# Patient Record
Sex: Female | Born: 1935 | Race: White | Hispanic: No | Marital: Married | Smoking: Former smoker
Health system: Southern US, Community
[De-identification: ages and names within clinical notes are randomized; demographics above are authoritative.]

## PROBLEM LIST (undated history)

## (undated) DIAGNOSIS — J439 Emphysema, unspecified: Secondary | ICD-10-CM

## (undated) DIAGNOSIS — C140 Malignant neoplasm of pharynx, unspecified: Secondary | ICD-10-CM

## (undated) DIAGNOSIS — Z923 Personal history of irradiation: Secondary | ICD-10-CM

## (undated) DIAGNOSIS — J449 Chronic obstructive pulmonary disease, unspecified: Secondary | ICD-10-CM

## (undated) DIAGNOSIS — K219 Gastro-esophageal reflux disease without esophagitis: Secondary | ICD-10-CM

## (undated) DIAGNOSIS — C3490 Malignant neoplasm of unspecified part of unspecified bronchus or lung: Secondary | ICD-10-CM

## (undated) DIAGNOSIS — C801 Malignant (primary) neoplasm, unspecified: Secondary | ICD-10-CM

## (undated) DIAGNOSIS — F419 Anxiety disorder, unspecified: Secondary | ICD-10-CM

## (undated) DIAGNOSIS — Z9221 Personal history of antineoplastic chemotherapy: Secondary | ICD-10-CM

## (undated) DIAGNOSIS — Z9981 Dependence on supplemental oxygen: Secondary | ICD-10-CM

## (undated) DIAGNOSIS — I10 Essential (primary) hypertension: Secondary | ICD-10-CM

## (undated) DIAGNOSIS — G309 Alzheimer's disease, unspecified: Secondary | ICD-10-CM

## (undated) DIAGNOSIS — F0280 Dementia in other diseases classified elsewhere without behavioral disturbance: Secondary | ICD-10-CM

## (undated) HISTORY — DX: Personal history of antineoplastic chemotherapy: Z92.21

## (undated) HISTORY — DX: Alzheimer's disease, unspecified: G30.9

## (undated) HISTORY — DX: Alzheimer's disease, unspecified: F02.80

## (undated) HISTORY — PX: TONSILLECTOMY: SUR1361

## (undated) HISTORY — DX: Chronic obstructive pulmonary disease, unspecified: J44.9

## (undated) HISTORY — PX: CHOLECYSTECTOMY: SHX55

## (undated) HISTORY — DX: Malignant neoplasm of unspecified part of unspecified bronchus or lung: C34.90

## (undated) HISTORY — DX: Dependence on supplemental oxygen: Z99.81

## (undated) HISTORY — DX: Malignant neoplasm of pharynx, unspecified: C14.0

## (undated) HISTORY — DX: Emphysema, unspecified: J43.9

## (undated) HISTORY — DX: Anxiety disorder, unspecified: F41.9

## (undated) HISTORY — DX: Gastro-esophageal reflux disease without esophagitis: K21.9

## (undated) HISTORY — PX: TOTAL ABDOMINAL HYSTERECTOMY: SHX209

## (undated) HISTORY — DX: Personal history of irradiation: Z92.3

---

## 2001-07-05 ENCOUNTER — Encounter: Payer: Self-pay | Admitting: Neurology

## 2001-07-05 ENCOUNTER — Encounter: Admission: RE | Admit: 2001-07-05 | Discharge: 2001-07-05 | Payer: Self-pay | Admitting: Neurology

## 2002-10-20 ENCOUNTER — Ambulatory Visit (HOSPITAL_COMMUNITY): Admission: RE | Admit: 2002-10-20 | Discharge: 2002-10-20 | Payer: Self-pay | Admitting: Cardiology

## 2006-07-14 ENCOUNTER — Ambulatory Visit: Payer: Self-pay | Admitting: Orthopedic Surgery

## 2006-11-16 ENCOUNTER — Ambulatory Visit: Payer: Self-pay | Admitting: Cardiology

## 2006-11-16 ENCOUNTER — Ambulatory Visit: Payer: Self-pay | Admitting: Pulmonary Disease

## 2006-11-16 ENCOUNTER — Ambulatory Visit: Payer: Self-pay | Admitting: Thoracic Surgery

## 2006-11-16 ENCOUNTER — Encounter: Payer: Self-pay | Admitting: Critical Care Medicine

## 2006-11-16 ENCOUNTER — Inpatient Hospital Stay (HOSPITAL_COMMUNITY): Admission: EM | Admit: 2006-11-16 | Discharge: 2006-12-08 | Payer: Self-pay | Admitting: Critical Care Medicine

## 2006-11-21 ENCOUNTER — Encounter: Payer: Self-pay | Admitting: Critical Care Medicine

## 2006-11-24 ENCOUNTER — Ambulatory Visit: Payer: Self-pay | Admitting: Internal Medicine

## 2006-11-25 ENCOUNTER — Encounter: Payer: Self-pay | Admitting: Critical Care Medicine

## 2006-12-03 ENCOUNTER — Encounter: Payer: Self-pay | Admitting: Thoracic Surgery

## 2006-12-06 ENCOUNTER — Encounter: Payer: Self-pay | Admitting: Thoracic Surgery

## 2006-12-14 ENCOUNTER — Ambulatory Visit: Admission: RE | Admit: 2006-12-14 | Discharge: 2007-01-03 | Payer: Self-pay | Admitting: Radiation Oncology

## 2006-12-17 ENCOUNTER — Ambulatory Visit (HOSPITAL_COMMUNITY): Admission: RE | Admit: 2006-12-17 | Discharge: 2006-12-17 | Payer: Self-pay | Admitting: Thoracic Surgery

## 2006-12-20 ENCOUNTER — Ambulatory Visit: Payer: Self-pay | Admitting: Thoracic Surgery

## 2006-12-21 ENCOUNTER — Encounter: Admission: RE | Admit: 2006-12-21 | Discharge: 2006-12-21 | Payer: Self-pay | Admitting: Thoracic Surgery

## 2006-12-21 ENCOUNTER — Ambulatory Visit: Payer: Self-pay | Admitting: Thoracic Surgery

## 2006-12-27 ENCOUNTER — Ambulatory Visit: Payer: Self-pay | Admitting: Pulmonary Disease

## 2007-01-07 ENCOUNTER — Ambulatory Visit: Payer: Self-pay | Admitting: Pulmonary Disease

## 2007-01-07 DIAGNOSIS — I472 Ventricular tachycardia: Secondary | ICD-10-CM

## 2007-01-07 DIAGNOSIS — I10 Essential (primary) hypertension: Secondary | ICD-10-CM | POA: Insufficient documentation

## 2007-01-07 DIAGNOSIS — J9600 Acute respiratory failure, unspecified whether with hypoxia or hypercapnia: Secondary | ICD-10-CM

## 2007-01-07 DIAGNOSIS — R042 Hemoptysis: Secondary | ICD-10-CM | POA: Insufficient documentation

## 2007-01-07 DIAGNOSIS — C3490 Malignant neoplasm of unspecified part of unspecified bronchus or lung: Secondary | ICD-10-CM | POA: Insufficient documentation

## 2007-01-07 DIAGNOSIS — J4489 Other specified chronic obstructive pulmonary disease: Secondary | ICD-10-CM | POA: Insufficient documentation

## 2007-01-07 DIAGNOSIS — J449 Chronic obstructive pulmonary disease, unspecified: Secondary | ICD-10-CM

## 2007-01-07 DIAGNOSIS — R69 Illness, unspecified: Secondary | ICD-10-CM

## 2007-01-07 DIAGNOSIS — F17200 Nicotine dependence, unspecified, uncomplicated: Secondary | ICD-10-CM

## 2007-02-11 ENCOUNTER — Ambulatory Visit: Admission: RE | Admit: 2007-02-11 | Discharge: 2007-03-31 | Payer: Self-pay | Admitting: Radiation Oncology

## 2007-05-10 ENCOUNTER — Ambulatory Visit (HOSPITAL_COMMUNITY): Admission: RE | Admit: 2007-05-10 | Discharge: 2007-05-10 | Payer: Self-pay | Admitting: Internal Medicine

## 2007-07-06 ENCOUNTER — Ambulatory Visit: Payer: Self-pay | Admitting: Thoracic Surgery

## 2007-07-15 ENCOUNTER — Encounter: Payer: Self-pay | Admitting: Thoracic Surgery

## 2007-07-15 ENCOUNTER — Ambulatory Visit (HOSPITAL_COMMUNITY): Admission: RE | Admit: 2007-07-15 | Discharge: 2007-07-15 | Payer: Self-pay | Admitting: Thoracic Surgery

## 2007-07-19 ENCOUNTER — Ambulatory Visit: Payer: Self-pay | Admitting: Thoracic Surgery

## 2007-09-28 ENCOUNTER — Ambulatory Visit: Payer: Self-pay | Admitting: Thoracic Surgery

## 2007-12-07 ENCOUNTER — Ambulatory Visit: Payer: Self-pay | Admitting: Thoracic Surgery

## 2007-12-07 ENCOUNTER — Encounter: Admission: RE | Admit: 2007-12-07 | Discharge: 2007-12-07 | Payer: Self-pay | Admitting: Thoracic Surgery

## 2008-04-03 ENCOUNTER — Ambulatory Visit (HOSPITAL_COMMUNITY): Admission: RE | Admit: 2008-04-03 | Discharge: 2008-04-03 | Payer: Self-pay | Admitting: Thoracic Surgery

## 2008-04-03 ENCOUNTER — Ambulatory Visit: Payer: Self-pay | Admitting: Thoracic Surgery

## 2008-04-06 ENCOUNTER — Encounter: Payer: Self-pay | Admitting: Thoracic Surgery

## 2008-04-06 ENCOUNTER — Ambulatory Visit (HOSPITAL_COMMUNITY): Admission: RE | Admit: 2008-04-06 | Discharge: 2008-04-06 | Payer: Self-pay | Admitting: Thoracic Surgery

## 2008-04-06 ENCOUNTER — Ambulatory Visit: Payer: Self-pay | Admitting: Thoracic Surgery

## 2008-04-10 ENCOUNTER — Ambulatory Visit: Payer: Self-pay | Admitting: Thoracic Surgery

## 2009-07-24 ENCOUNTER — Ambulatory Visit: Payer: Self-pay | Admitting: Thoracic Surgery

## 2009-07-30 ENCOUNTER — Ambulatory Visit: Payer: Self-pay | Admitting: Thoracic Surgery

## 2009-07-30 ENCOUNTER — Ambulatory Visit (HOSPITAL_COMMUNITY): Admission: RE | Admit: 2009-07-30 | Discharge: 2009-07-30 | Payer: Self-pay | Admitting: Thoracic Surgery

## 2009-08-07 ENCOUNTER — Ambulatory Visit: Payer: Self-pay | Admitting: Thoracic Surgery

## 2010-01-20 ENCOUNTER — Ambulatory Visit (HOSPITAL_COMMUNITY)
Admission: RE | Admit: 2010-01-20 | Discharge: 2010-01-20 | Payer: Self-pay | Source: Home / Self Care | Attending: Internal Medicine | Admitting: Internal Medicine

## 2010-01-21 ENCOUNTER — Ambulatory Visit
Admission: RE | Admit: 2010-01-21 | Discharge: 2010-01-21 | Payer: Self-pay | Source: Home / Self Care | Attending: Internal Medicine | Admitting: Internal Medicine

## 2010-01-22 LAB — GLUCOSE, CAPILLARY: Glucose-Capillary: 105 mg/dL — ABNORMAL HIGH (ref 70–99)

## 2010-02-07 NOTE — Consult Note (Addendum)
[name], Heather Rosales                   ACCOUNT NO.:  [id number]  MEDICAL RECORD NO.:  [id number]           PATIENT TYPE:  LOCATION:                                 FACILITY:  PHYSICIAN:  Lionel December, M.D.    DATE OF BIRTH:  [birth year]  DATE OF CONSULTATION:  01/21/2010 DATE OF DISCHARGE:                                CONSULTATION   REASON FOR CONSULT:  GERD.  HISTORY OF PRESENT ILLNESS:  Ms. Heather Rosales is a 77 year old female referred to our office by Dr. Derald Macleod for GERD.  She does have a history of lung cancer and COPD.  Ms. Pittsley states she is having acid reflux every night.  This has been occurring approximately 2 years since her lung cancer was diagnosed.  She is presently taking omeprazole which is not completely controlling her symptoms.  Her appetite is okay.  She states she just does not feel hungry.  She has had weight loss of approximately 10 pounds since 2008 after being diagnosed with lung cancer.  She states her acid reflux rarely occurs during the day.  She does have a cough.  Her cough is not new.  She did receive radiation and chemo in 2009.  On January 10 of this year, she underwent a CT of the chest and abdomen with contrast media which revealed no findings or malignancy in the upper abdomen.  Mild fatty infiltration of the liver.  She is allergic to PENICILLIN.  Home medications include: 1. Omeprazole 40 mg 2 a day. 2. Lotrel 5/40 one a day. 3. Spiriva 18 mics 1 puff daily. 4. Atenolol 50 mg 2 a day. 5. FiberChoice one daily. 6. Os-Cal 500 one a day. 7. MVI one a day. 8. Diovan/hydrochlorothiazide 160/125 mg one a day. 9. Hydrochlorpher for cough as needed.  SURGERIES:  She has had a cholecystectomy in the past and repair of a deviated septum.  Previous medical history includes lung cancer, throat cancer and she has a radiation and chemo.  Her last colonoscopy was about a year ago by Dr. Cleotis Nipper which was normal.  FAMILY HISTORY:  Her mother is  deceased of unknown cause.  Her father is deceased from lung cancer.  She has 2 sisters that are deceased, one from a lymph node cancer and one from questionable some type of cancer. She did not know.  She had one brother alive with a history of lung cancer.  She is married.  She is retired.  She quit smoking in 2008 after smoking one pack a day for 50 years or greater.  She does not drink or do drugs.  She has 4 children in good health.  One daughter, however, did have breast cancer in 2006 and is in remission.  OBJECTIVE:  VITAL SIGNS:  Her weight is 140.5, her height is 5 feet 2 inches, her temp is 99, blood pressure is 140/52, pulse is 62. MOUTH:  She has natural teeth.  Her oral mucosa is moist. NECK:  There are no lesions.  Her thyroid is normal.  There is no cervical lymphadenopathy. EYES:  Her conjunctivae are pink.  Her sclerae are anicteric. HEART:  Regular rate and rhythm. ABDOMEN:  Soft.  Bowel sounds are positive.  No masses.  ASSESSMENT:  Ms. Heather Rosales is a 77 year old female with a history of gastroesophageal reflux disease which usually occurs at night.  Peptic ulcer disease or complicated GERD needs to be ruled out. Marland Kitchen  RECOMMENDATIONS:  We will schedule an EGD with Dr. Karilyn Cota.    ______________________________ Dorene Ar, NP   ______________________________ Lionel December, M.D.    TS/MEDQ  D:  01/21/2010  T:  01/22/2010  Job:  [id number]  cc:   Normand Sloop, MD Fax: [phone]  Electronically Signed by Dorene Ar PA on 02/25/2010 12:08:37 PM Electronically Signed by Lionel December M.D. on 03/22/2010 02:19:43 PM

## 2010-02-26 ENCOUNTER — Other Ambulatory Visit (INDEPENDENT_AMBULATORY_CARE_PROVIDER_SITE_OTHER): Payer: Self-pay | Admitting: Internal Medicine

## 2010-02-26 ENCOUNTER — Encounter (HOSPITAL_BASED_OUTPATIENT_CLINIC_OR_DEPARTMENT_OTHER): Payer: Medicare Other | Admitting: Internal Medicine

## 2010-02-26 ENCOUNTER — Ambulatory Visit (HOSPITAL_COMMUNITY)
Admission: RE | Admit: 2010-02-26 | Discharge: 2010-02-26 | Disposition: A | Discharge status: Home / Self Care | Payer: Medicare Other | Source: Ambulatory Visit | Attending: Internal Medicine | Admitting: Internal Medicine

## 2010-02-26 DIAGNOSIS — J449 Chronic obstructive pulmonary disease, unspecified: Secondary | ICD-10-CM | POA: Insufficient documentation

## 2010-02-26 DIAGNOSIS — Z85118 Personal history of other malignant neoplasm of bronchus and lung: Secondary | ICD-10-CM

## 2010-02-26 DIAGNOSIS — K219 Gastro-esophageal reflux disease without esophagitis: Secondary | ICD-10-CM

## 2010-02-26 DIAGNOSIS — Q2733 Arteriovenous malformation of digestive system vessel: Secondary | ICD-10-CM | POA: Insufficient documentation

## 2010-02-26 DIAGNOSIS — J4489 Other specified chronic obstructive pulmonary disease: Secondary | ICD-10-CM | POA: Insufficient documentation

## 2010-02-26 DIAGNOSIS — I10 Essential (primary) hypertension: Secondary | ICD-10-CM | POA: Insufficient documentation

## 2010-03-03 ENCOUNTER — Encounter (HOSPITAL_COMMUNITY)
Admit: 2010-03-03 | Discharge: 2010-03-03 | Disposition: A | Discharge status: Home / Self Care | Payer: Medicare Other | Source: Ambulatory Visit | Attending: Internal Medicine | Admitting: Internal Medicine

## 2010-03-03 ENCOUNTER — Encounter (HOSPITAL_COMMUNITY): Payer: Self-pay

## 2010-03-03 DIAGNOSIS — K219 Gastro-esophageal reflux disease without esophagitis: Secondary | ICD-10-CM | POA: Insufficient documentation

## 2010-03-03 HISTORY — DX: Essential (primary) hypertension: I10

## 2010-03-03 HISTORY — DX: Malignant (primary) neoplasm, unspecified: C80.1

## 2010-03-03 MED ORDER — TECHNETIUM TC 99M SULFUR COLLOID
2.0000 | Freq: Once | INTRAVENOUS | Status: AC | PRN
  Administered 2010-03-03: 1.95 via INTRAVENOUS

## 2010-03-22 LAB — COMPREHENSIVE METABOLIC PANEL
ALT: 13 U/L (ref 0–35)
AST: 14 U/L (ref 0–37)
Calcium: 9.2 mg/dL (ref 8.4–10.5)
GFR calc Af Amer: >60 mL/min (ref >60)
Sodium: 136 mEq/L (ref 135–145)
Total Protein: 6.1 g/dL (ref 6.0–8.3)

## 2010-03-22 LAB — CBC
MCHC: 32.2 g/dL (ref 30.0–36.0)
Platelets: 228 10*3/uL (ref 150–400)
RDW: 18.8 % — ABNORMAL HIGH (ref 11.5–15.5)

## 2010-03-22 LAB — PROTIME-INR: Prothrombin Time: 12.8 seconds (ref 11.6–15.2)

## 2010-03-22 LAB — SURGICAL PCR SCREEN
MRSA, PCR: NEGATIVE
Staphylococcus aureus: NEGATIVE

## 2010-03-22 NOTE — Op Note (Signed)
Heather Rosales, Heather Rosales                   ACCOUNT NO.:  [id number]  MEDICAL RECORD NO.:  [id number]           PATIENT TYPE:  O  LOCATION:  DAYP                          FACILITY:  [facility]  PHYSICIAN:  Lionel December, M.D.    DATE OF BIRTH:  05-16-1930  DATE OF PROCEDURE:  02/26/2010 DATE OF DISCHARGE:                              OPERATIVE REPORT   PROCEDURE:  Esophagogastroduodenoscopy.  INDICATIONS:  Heather Rosales is 77 year old Caucasian female with a chronic GERD and currently on double dose PPI who is still having problems.  She is mainly having throat symptoms.  She wakes up middle of night with liquid in her throat.  She states it is very foul smelling.  She denies postnasal discharge.  She also has cough.  She has history of stage III non-small cell lung carcinoma which was diagnosed late 2008, and she received chemoradiation in 2009 and she has remained in remission.  Last year, she underwent multiple studies and no recurrence noted.  This year, she also had abdominopelvic CT, chest CT, and a PET scan and no evidence of recurrent disease.  She is undergoing diagnostic EGD to determine whether or not her throat symptoms are due to GERD or not. Procedures were reviewed with the patient.  Informed consent was obtained.  MEDS FOR CONSCIOUS SEDATION:  Cetacaine spray for oropharyngeal topical anesthesia, Demerol 50 mg IV, Versed 4 mg IV.  FINDINGS:  Procedure performed in endoscopy suite.  The patient's vital signs and O2 sat were monitored during the procedure and remained stable.  The patient was placed in left lateral recumbent position and Pentax videoscope was passed through oropharynx without any difficulty into esophagus.  Oropharyngeal mucosa is somewhat dry, otherwise unremarkable.  Laryngeal inlet was also unremarkable.  Scope was easily passed into esophagus.  Esophagus.  In the proximal esophagus between 22 and 24 cm, the incisors was noncritical ring with pallor or scar to the  mucosa and there was focal area with very slight or faint mucosal irregularity.  This area was brushed for cytology on the way out, but I did not see any obvious mass or ulcer.  Rest of the esophageal mucosa was normal.  GE junction was located at 38 cm from the incisors and was unremarkable.  Stomach.  It was empty and distended very well by insufflation.  Folds of proximal stomach are normal.  Examination of mucosa at body, antrum, pyloric channel, as well as angularis, fundus, and cardia was normal.  Duodenum.  Bulbar mucosa was normal.  Scope was passed in second part of the duodenum.  There was single small AV malformation which was not bleeding and left alone.  Endoscope was withdrawn.  The patient tolerated the procedure well.  FINAL DIAGNOSES: 1. Focal scarring with mucosal irregularity at proximal esophagus,     felt to be radiation-induced injury, but no evidence of ulcer or     mass or stricture. 2. No evidence of erosive esophagitis or Barrett's esophagus. 3. No evidence of pyloric stenosis or peptic ulcer disease. 4. Given endoscopic findings, I am not convinced that her throat  symptoms are primarily due to gastroesophageal reflux disease.  However, I would like to make sure, she does not have gastroparesis.  RECOMMENDATIONS: 1. She will continue antireflux measures and omeprazole as before. 2. We will schedule for solid phase gastric emptying study. 3. I will be contacting patient with results of breast cytology from     esophagus. 4. If her gastric emptying study is normal, I would ask Dr. Sherril Croon to     consider either sinus CT or another course of antibiotics, etc.     Lionel December, M.D.     NR/MEDQ  D:  02/26/2010  T:  02/26/2010  Job:  [id number]  cc:   Dr. Derald Macleod  Dr. Ovidio Kin, M.D. [address] [place], [place] [address]  Electronically Signed by Lionel December M.D. on 03/22/2010 02:20:03 PM

## 2010-04-16 LAB — COMPREHENSIVE METABOLIC PANEL
ALT: 12 U/L (ref 0–35)
AST: 13 U/L (ref 0–37)
Albumin: 3.6 g/dL (ref 3.5–5.2)
Alkaline Phosphatase: 75 U/L (ref 39–117)
Glucose, Bld: 103 mg/dL — ABNORMAL HIGH (ref 70–99)
Potassium: 4.2 mEq/L (ref 3.5–5.1)
Sodium: 140 mEq/L (ref 135–145)
Total Protein: 5.9 g/dL — ABNORMAL LOW (ref 6.0–8.3)

## 2010-04-16 LAB — CBC
Hemoglobin: 11.7 g/dL — ABNORMAL LOW (ref 12.0–15.0)
RDW: 16.7 % — ABNORMAL HIGH (ref 11.5–15.5)

## 2010-04-16 LAB — CULTURE, RESPIRATORY W GRAM STAIN

## 2010-04-16 LAB — PROTIME-INR: Prothrombin Time: 13 seconds (ref 11.6–15.2)

## 2010-04-16 LAB — AFB CULTURE WITH SMEAR (NOT AT ARMC)

## 2010-05-20 NOTE — Letter (Signed)
July 24, 2009   Normand Sloop, MD  [address]  [place], [place]  [address]   Re:  Heather Rosales, Heather Rosales             DOB:  Jan 25, 1930   Dear Jeb Levering,   I saw the patient in the office today and I scheduled her Port-A-Cath  removal for July 26.  Her blood pressure was 133/69, pulse is 70,  respirations 18, sats were 92%.  She is doing remarkably well from my  standpoint, and I will be happy to see her again if she has any future  problems.    Sincerely,   Ines Bloomer, M.D.  Electronically Signed   DPB/MEDQ  D:  07/24/2009  T:  07/25/2009  Job:  [id number]

## 2010-05-20 NOTE — Op Note (Signed)
[name], Heather Rosales                   ACCOUNT NO.:  [id number]   MEDICAL RECORD NO.:  [id number]          PATIENT TYPE:  AMB   LOCATION:  SDS                          FACILITY:  [facility]   PHYSICIAN:  Ines Bloomer, M.D. DATE OF BIRTH:  [birth year]   DATE OF PROCEDURE:  07/15/2007  DATE OF DISCHARGE:                               OPERATIVE REPORT   PREOPERATIVE DIAGNOSIS:  Stage IIIA nonsmall cell lung cancer, status  post radiation and chemotherapy.   POSTOPERATIVE DIAGNOSIS:  Stage IIIA nonsmall cell lung cancer, status  post radiation and chemotherapy.   OPERATION PERFORMED:  Video bronchoscopy with EBUS.   ANESTHESIA:  General anesthesia.   PROCEDURE:  After adequate general anesthesia, the video bronchoscope  was passed through the endotracheal tube.  The carina was in the  midline.  The left upper lobe and left lower orifices were normal.  The  right main stem, right upper lobe, and right lower lobe orifices were  normal.  The video bronchoscope was removed, EBUS bronchoscope was  inserted, and exploration was carried out with the EBUS bronchoscope.  We identified a 4R node that had been previously positive on the PET  scan prior to the treatment, and it was now negative.   Using an endobronchial ultrasound for guidance, we did a Wang needle of  this with 3 passes aspirating the node.  The EBUS bronchoscope was then  removed.  The patient tolerated the procedure well, and was returned to  the recovery room in stable condition.      Ines Bloomer, M.D.  Electronically Signed     DPB/MEDQ  D:  07/15/2007  T:  07/16/2007  Job:  [id number]   cc:   Lebron Conners. Darovsky, M.D.

## 2010-05-20 NOTE — Letter (Signed)
April 03, 2008   Boris M. Darovsky, MD  [address]  [place], [place] [address]   Re:  Heather Rosales, Heather Rosales             DOB:  1930-03-24   Dear Dr. Ubaldo Glassing:   I saw the patient back today.  She had a mild episode of hemoptysis.  She has a PET scan done today that showed no evidence of any recurrence.  There is a small new 8-mm nodule in the right lower lobe, which we will  need to follow, which showed no uptake.  Because of her hemoptysis, I  think she needs to have a repeat bronchoscopy, and we have scheduled it  for this Friday, April 07, 2008, at [facility].  I will let she know  our findings.   Sincerely,   Ines Bloomer, M.D.  Electronically Signed   DPB/MEDQ  D:  04/03/2008  T:  04/04/2008  Job:  [id number]

## 2010-05-20 NOTE — Letter (Signed)
August 07, 2009   Boris Judie Petit. Darovsky, MD  [address] [place], [place] [address]   Re:  Heather Rosales, Heather Rosales             DOB:  1930-11-14   Dear Dr. Ubaldo Glassing:   The patient returned today after removal of Port-A-Cath.  Incision is  well healed.  Her blood pressure is 148/80, pulse 64, respirations 18,  sats 92%.  She is complaining of acid reflux for which she is on  omeprazole for this, but she says it has been there quite a bit, so I  suggested that she check with Dr. Sherril Croon and possibly refer to Dr. Karilyn Cota  for possible workup.  I will be happy to see her again if she has any  future problems.   Ines Bloomer, M.D.  Electronically Signed   DPB/MEDQ  D:  08/07/2009  T:  08/08/2009  Job:  [id number]   cc:   Doreen Beam, MD

## 2010-05-20 NOTE — Op Note (Signed)
NAMEGABRIELL, Heather Rosales                   ACCOUNT NO.:  [id number]   MEDICAL RECORD NO.:  [id number]          PATIENT TYPE:  AMB   LOCATION:  SDS                          FACILITY:  [facility]   PHYSICIAN:  Ines Bloomer, M.D. DATE OF BIRTH:  10-07-1930   DATE OF PROCEDURE:  DATE OF DISCHARGE:  12/17/2006                               OPERATIVE REPORT   PREOPERATIVE DIAGNOSIS:  Stage IIIA non-small cell lung cancer.   POSTOPERATIVE DIAGNOSIS:  Stage IIIA non-small cell lung cancer.   OPERATION PERFORMED:  Insertion of right IJ Port-A-Cath.   SURGEON:  Dr. Patricia Nettle. Burney.   ANESTHESIA:  1% Xylocaine and IV sedation.   After prep and drape in the anterior chest and both necks, an attempt  was made to do a left IJ Port-A-Cath and the guidewire would not thread.  It kept going up the right neck into the right IJ. Despite several  wires, I could not get it to turn down into the SVC. So we abandoned  that and infiltrated 1% Xylocaine at the right IJ and made  a right IJ  puncture and threaded the guidewire under fluoro to the right atrium. A  stab wound was made around the guidewire, another area was infiltrated  with 1% Xylocaine. On the anterior chest wall a  transverse incision was  made and a pocket was dissected out for the [employer].  It was inserted in the pocket and sutured in place with 3-0 silk through  the suture port and then the tubing was tunneled to the stab wound  around the guidewire.  The tubing was measured appropriately to be in  the right atrium and cut and then over the guidewire was passed a  dilator with a peel-away sheath and through the peel-away sheath the  tubing was passed, the peel-away sheath was removed and confirmed to be  in the right atrial SVC junction by fluoro. It flushed easy and injected  easily. The wound was closed with 3-0 Vicryl in the subcutaneous tissue  and Dermabond for the skin. The patient was returned to the recovery  room in  stable condition.      Ines Bloomer, M.D.  Electronically Signed     DPB/MEDQ  D:  12/17/2006  T:  12/18/2006  Job:  [id number]

## 2010-05-20 NOTE — Discharge Summary (Signed)
[name], Rosales                   ACCOUNT NO.:  [id number]   MEDICAL RECORD NO.:  [id number]          PATIENT TYPE:  INP   LOCATION:  2013                         FACILITY:  [facility]   PHYSICIAN:  Oretha Milch, MD      DATE OF BIRTH:  05-Dec-1930   DATE OF ADMISSION:  11/16/2006  DATE OF DISCHARGE:  12/08/2006                               DISCHARGE SUMMARY   DISCHARGE DIAGNOSES:  1. Vent-dependent respiratory failure.  2. Gastrointestinal obstruction and ileus.  3. Hyperglycemia.  4. Bilateral pneumothoraces, pneumomediastinum & pneumoperitoneum from      difficult airway intubation & trauma.  5. 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 Rt adrenal involvement   HISTORY OF PRESENT ILLNESS:  Heather Rosales is a 77 year old white female  with known chronic obstructive pulmonary disease and continued tobacco  abuse and hypertension, who was transferred to [facility] via 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  She required  intubation and mechanical ventilatory support with arterial blood gases  demonstrating a pH of 7.28, pCO2 of 67, pO2 of 306.  She was noted to  have a run of ventricular tachycardia and required interventions.  Prior  to being admitted, the patient was noted to be fine.   LINES/TUBES:  1. She had a left peripheral IV placed x2.  2. She had an endotracheal tube placed on November 15, 2006, with      removal on November 25, 2006.  3. The NG tube on November 15, 2006, was subsequently removed.  4. Left radial arterial line from November 16, 2006, #21.  5. Left IJ central venous catheter which has been removed.  6. A right chest tube was placed on November 17, 2006, and removed on      November 25, 2006.  7. Left chest tube placed on November 17, 2006, and removed on      November 28, 2006.   LABORATORY DATA:   Blood cultures x2 on November 16, 2006, with no  growth.  On November 16, 2006, urine was no growth.  Sputum showed no  organism on November 16, 2006.  On November 21, 2006, pleural fluid  showed no growth.   A 2-D echocardiogram demonstrated an ejection fraction of 70%.   Hemoglobin 10.3, hematocrit 30.8, WBC 9.7, platelets 254.  Sodium 138,  potassium 4.2, chloride 100, CO2 of 31, BUN 8, creatinine 0.52, glucose  126, calcium 9.1.   RADIOGRAPHIC DATA:  PET scan on December 06, 2006, demonstrates uptake  with right paratracheal pre-carinal enlarged lymph nodes measuring 3.1  cm x 3.4 cm and maximally 5.1 cm.  Right-sided pleural and intercostal  activity corresponds to mild pleural thickening.  Mild activity in the  lateral aspect of the right lower lobe.  Air space disease, likely due  to atelectasis.  Right paratracheal lymph node demonstrates moderate  hypermetabolism.  Other paratracheal and recessed nodes demonstrate mild  equivocal hypermetabolism.  No primary  bronchogenic carcinoma is  identified.  Cannot exclude subtle early right adrenal metastases.  Attention on followup.   A chest x-ray shows bilateral pleural effusion, left greater than right,  bi-basilar atelectasis and new pneumothorax.   A CT of the neck revealed a subtle area of mucosal thickening in the  right posterior lateral aspect of the proximal sub-glottic trachea, in  the same region as the mucosal abnormalities seen at the time of  endoscopy.   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  Number 4 shows metastatic non-small-cell  carcinoma, features favor squamous cell carcinoma.  Present section  demonstrates non-small-cell carcinoma.   HOSPITAL COURSE:  #1 - VENT-DEPENDENT RESPIRATORY FAILURE:  The patient  was transferred to [facility] from [facility] in acute respiratory distress, requiring mechanical  ventilatory  support.  Of note, she has continued tobacco abuse, despite being on  home oxygen at night.  This may have contributed to it.  By the time she  arrived at [facility], she was found to have bilateral  pneumothoraces.  This was speculated from a difficult airway, as noted  prior to her transfer to [facility].  She subsequently  required bilateral chest tubes, right and left, with subsequent removal  after resolution of bilateral pneumothoraces.  She was successfully  weaned from mechanical ventilatory support.  Subsequently she went for a  mediastinoscopy on December 03, 2006, per Dr. Ines Bloomer which  demonstrated the findings as noted in the pathology of squamous cell  carcinoma, subglottic mass, with pre-carinal and paratracheal findings.  She has been arranged to follow up with Dr. Lynett Fish in [place],  [place] for radiation and chemotherapy if they deem necessary.  She will also follow up with Dr. Edwyna Shell at a later time.  She will also  follow up with Dr. Comer Locket. Alva of the pulmonary division  at a later  time.  She has been instructed to continue her relationship with Dr.  Orson Aloe in [place], [place].   #2 - CHRONIC OBSTRUCTIVE PULMONARY DISEASE:  She has proven to be O2-  dependent.  She was ambulating in the hall and found to desaturate to  84%.  Therefore she will be on home oxygen 24/7.  She has been  instructed not to smoke, although she continues to down-play the  importance of her smoking as a cause of her shortness of breath.   #3 - ILEUS:  This has resolved.   #4 - HYPERGLYCEMIA:  This is under control.   #5 - HYPERTENSION:  She will be placed back on her home medications.   DISCHARGE MEDICATIONS:  1. Norvasc 10 mg daily.  2. Spiriva 18 mcg daily.  3. Atenolol 50 mg twice daily.  4. Albuterol 2.5 mg hand held nebulizer four times daily and an extra      two times if needed.  5. Home O2, 24  hours a day at 2 liters.   FOLLOWUP:  1. With Dr. Vassie Loll.  2. With Dr. Cleone Slim.  3. With Dr. Edwyna Shell.   DIET:  Is a heart-healthy diet.   SPECIAL INSTRUCTIONS:  She is not to smoke.   DISPOSITION/CONDITION ON DISCHARGE:  Her vent-dependent respiratory  failure has resolved.  Her bilateral pneumothoraces resolved.  There is  a sub-glottic mass that has been evaluated with a mediastinoscopy and  found to be squamous cell carcinoma, with suspected metastases,  which  will be further evaluated in the future per oncology.  The chronic  obstructive pulmonary disease is now 24/7, O2-dependent.  She is being  discharged in an improved condition.      Devra Dopp, MSN, ACNP      Oretha Milch, MD  Electronically Signed    SM/MEDQ  D:  12/08/2006  T:  12/08/2006  Job:  [id number]   cc:   Ines Bloomer, M.D.  Lynett Fish, M.D.

## 2010-05-20 NOTE — Op Note (Signed)
NAMEQUANTASIA, Heather Rosales                   ACCOUNT NO.:  [id number]   MEDICAL RECORD NO.:  [id number]          PATIENT TYPE:  INP   LOCATION:  2013                         FACILITY:  [facility]   PHYSICIAN:  Ines Bloomer, M.D. DATE OF BIRTH:  01-03-1931   DATE OF PROCEDURE:  DATE OF DISCHARGE:                               OPERATIVE REPORT   PREOPERATIVE DIAGNOSIS:  Mediastinal adenopathy.   POSTOPERATIVE DIAGNOSIS:  Mediastinal adenopathy.  Non-small cell lung  cancer., possible subglottic mass.   SURGEON:  Dr. Patricia Nettle. Burney.   ANESTHESIA:  General anesthesia.   OPERATION PERFORMED:  Video bronchoscopy, mediastinoscopy.   PROCEDURE IN DETAIL:  After percutaneous insertion of monitoring lines,  the patient underwent general anesthesia was prepped and draped in the  usual sterile manner.  The video bronchoscope was passed through the  endotracheal tube.  The carina was at the midline.  The left mainstem,  left upper lobe and left lower lobe orifices were normal.  In the right  mainstem there was a small 8-10 mm nodule that was free floating in the  right mainstem bronchus. This was grasped and removed and sent for  pathologic examination.  They had difficulty in intubation and it was  thought that this may have been dislodged with intubation.  The right  middle lobe and right lower lobe orifices were normal.  The right upper  lobe orifices were normal.  We then pulled the endotracheal tube back to  the subglottic level and there were areas of marked irregularity in that  area and we did biopsies of the area.  This could have been where she  was torn on a difficult intubation or this could have been a subglottic  tumor.  The video bronchoscope was removed.  We then prepped and draped  in the usual sterile manner.  A transverse incision was made above the  sternal notch and carried down with electrocautery to the pretracheal  fascia.  We then biopsied 4R and 2R nodes.  The second biopsy was  very  difficult to biopsy because it was so scarred in, but frozen section  revealed non-small cell lung cancer. More  of the node was sent for  permanent section.  The video bronchoscope was removed. The anterior  neck was closed with interrupted 2-0 Vicryl and then running 3-0 Vicryl  and Dermabond for the skin.  The patient was returned to the recovery  room in stable condition.      Ines Bloomer, M.D.  Electronically Signed     DPB/MEDQ  D:  12/03/2006  T:  12/03/2006  Job:  [id number]   cc:   Ines Bloomer, M.D.

## 2010-05-20 NOTE — Letter (Signed)
July 06, 2007   Boris M. Darovsky, M.D.  [address] [place], [place] [address]   Re:  ILISHA, BLUST             DOB:  [birth year]   Dear Dr. Ubaldo Glassing;  I saw this patient back today.  She has completed her radiation and her  chemotherapy.  She has multiple complaints mainly hoarseness, excessive  sputum, and coughing.  Her PET scan really looks good with minimal  uptake in the mediastinum, where she received her radiation.  Her  incisions are well-healed.  Port-A-Cath is healing well.  Because of her  hoarseness and cough, I think it might be worth repeating her  bronchoscopy.  Her blood pressure is 142/70, pulse 80, respirations 22,  and sats were 90%.   I am planning to do repeat bronchoscopy on the 9th at [facility]   Ines Bloomer, M.D.  Electronically Signed   DPB/MEDQ  D:  07/06/2007  T:  07/06/2007  Job:  [id number]

## 2010-05-20 NOTE — Consult Note (Signed)
Heather Rosales, Heather Rosales                   ACCOUNT NO.:  [id number]   MEDICAL RECORD NO.:  [id number]          PATIENT TYPE:  INP   LOCATION:  2116                         FACILITY:  [facility]   PHYSICIAN:  Ines Bloomer, M.D. DATE OF BIRTH:  Jul 15, 1930   DATE OF CONSULTATION:  DATE OF DISCHARGE:                                 CONSULTATION   CHIEF COMPLAINT:  Mediastinal air and hemoptysis.   HISTORY OF PRESENT ILLNESS:  This 77 year old patient has a long history  of tobacco abuse, was admitted in 2006 with workup for hemoptysis and  questionable mediastinal adenopathy and bronchoscopy.  Had a  bronchoscopy done by Dr. Dareen Piano which was negative and was then  followed by Dr. Orson Aloe. 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  While in the  ICU she had a right IJ catheter inserted, a left chest tube and then a  right chest tube inserted.  She is apparently had a syncopal episode  when she started bleeding.  She also had an episode of V-Tach at  [facility]  Her bronchoscopy yesterday which showed no source  of the bleeding, no endobronchial lesions but just old dark blood in the  right middle lobe and right lower lobe and left lower lobe bronchus.  Today she has developed emphysema, has a 1/7 air leak on the right.   PAST MEDICAL HISTORY:  1. She has hypertension.  2. She is on home O2.  3. She has a history of hypertension.   MEDICATIONS:  Recorded in the chart.   FAMILY HISTORY:  Negative except for positive for questionable history  of TB in the family.   REVIEW OF  SYSTEMS:  Unobtainable but apparently she has had no major  problems prior to this episode on November 15, 2006.   PHYSICAL EXAMINATION:  GENERAL:  She is a slightly obese Caucasian  female that is on a respirator.  VITAL SIGNS:  Blood pressure 120/80, pulse 100, respirations 18 and  saturations are 94%.  HEENT:  She is intubated.  NECK:  There is some palpable subcu emphysema, as well as in the  anterior chest wall.  There is a right IJ.  CHEST:  There are bilateral chest tubes.  LUNGS:  Clear with no wheezing.  HEART:  Regular sinus rhythm. No murmurs.  ABDOMEN:  Obese.  NG tube in place.  Bowel sounds are decreased.  EXTREMITIES:  Pulses are 1+.  There is 1+ edema.  NEUROLOGIC:  She is sedated on the respirator.   IMPRESSION:  1. Hemoptysis.  2. Anterior mediastinal air.  3. Chronic obstructive pulmonary disease with a history of tobacco      abuse and on oxygen.  4. History of hemoptysis.  5. Hypertension.  6. Anterior mediastinal air.  7. Mediastinal adenopathy.   PLAN:  Continue ventilatory support.  Repeat CT scan tomorrow.  Evaluate  for subcu air, anterior mediastinal air and rule out tracheal or  pharyngeal trauma.      Ines Bloomer, M.D.  Electronically Signed     DPB/MEDQ  D:  11/17/2006  T:  11/17/2006  Job:  [id number]

## 2010-05-20 NOTE — Letter (Signed)
September 28, 2007   Lebron Conners. Darovsky, MD  [address]  [place], [place] [address]   Re:  Heather Rosales, Heather Rosales             DOB:  12/07/1930   Dear Jeb Levering,   I saw the patient back today and her previous CT scan shows a decrease  in her mediastinal adenopathy.  She is doing well overall and I agree  with you discontinuing to flush her Port-A-Cath and repeat her CT scan  in about 3 months.  I will be happy to see her again if there is any  other problem.  I appreciate the opportunity of seeing the patient.   Ines Bloomer, M.D.  Electronically Signed   DPB/MEDQ  D:  09/28/2007  T:  09/29/2007  Job:  [id number]

## 2010-05-20 NOTE — Op Note (Signed)
[name], [name]                   ACCOUNT NO.:  [id number]   MEDICAL RECORD NO.:  [id number]           PATIENT TYPE:   LOCATION:                                 FACILITY:   PHYSICIAN:  Heather Rosales, M.D. DATE OF BIRTH:  1930/06/24   DATE OF PROCEDURE:  DATE OF DISCHARGE:                               OPERATIVE REPORT   PREOPERATIVE DIAGNOSIS:  Hemoptysis.   POSTOPERATIVE DIAGNOSIS:  Hemoptysis.   OPERATION PERFORMED:  Fiberoptic bronchoscopy.   SURGEON:  Heather Bloomer, MD   ANESTHESIA:  Cetacaine, Xylocaine, and IV sedation.   After adequate anesthesia, the video bronchoscope was passed through the  mouth.  The cords were inspected.  It appeared that were was some  weakness of the right cord which was explained by hoarseness.  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  It appeared to be well healed, although  there was some slight irregularity of the trachea in that area and that  it went more posteriorly and the posterior membrane was more  hyperdynamic.  The carina was in midline.  The left mainstem and right  mainstem bronchus were normal.  Left upper lobe, left lower lobe  orifices were normal.  The right upper lobe, right middle lobe, and  right lower lobe orifices were normal.  Cultures and cytologies were  taken.  The video bronchoscope was removed.  The patient was returned to  the recovery room in stable condition.      Heather Rosales, M.D.  Electronically Signed     DPB/MEDQ  D:  04/06/2008  T:  04/06/2008  Job:  [id number]

## 2010-05-20 NOTE — Letter (Signed)
April 10, 2008   Boris M. Darovsky, MD  [address] [place], [place] [address]   Re:  Heather, Rosales             DOB:  04/25/1930   Dear Dr. Ubaldo Glassing:   I saw the patient back after her bronchoscopy and at the time of  surgery, we saw no evidence of recurrence of her cancer and no area,  therefore she could be having hemoptysis.  Overall, she is doing well.  A culture did show Streptococcus pneumoniae and we started her on #7  days of Avelox, but from my standpoint, I do not think there is any  problem from recurrent cancer.  Her blood pressure was 157/83, pulse 74,  respirations 20, and sats were 92%.   Ines Bloomer, M.D.  Electronically Signed   DPB/MEDQ  D:  04/10/2008  T:  04/11/2008  Job:  [id number]

## 2010-05-20 NOTE — Letter (Signed)
December 07, 2007   Boris Judie Petit. Darovsky, MD  [address]  [place], [place] [address]   Re:  Heather Rosales, Heather Rosales             DOB:  1930-09-30   Dear Dr. Ubaldo Glassing:   I saw the patient back today and we went ahead and got a CT scan on her.  You have another one scheduled in about a week and it showed the  decrease in her adenopathy and has enlarged node with the largest node  now being 1.6 cm.  We will be bringing the old copy of this.  She is  still having some trouble with questionable reflux, some hoarseness, and  trouble swallowing and seeing a speech pathologist who recommended she  see a gastroenterologist.  I do think seeing Dr. Karilyn Cota would help since  she had radiation in this area, so I do not think that there is anything  else we can do at the present time.  I will see her back again in 3  months with a chest x-ray.   Ines Bloomer, M.D.  Electronically Signed   DPB/MEDQ  D:  12/07/2007  T:  12/07/2007  Job:  [id number]   cc:   Lionel December, M.D.

## 2010-05-20 NOTE — Letter (Signed)
December 21, 2006   Lebron Conners. Darovsky, M.D.  [address] [place], [place] [address]   Re:  Heather Rosales, Heather Rosales             DOB:  1930-12-18   I saw the patient in the office today and a Port-A-Cath in good  position.  Her sats were only 88%.  She is on oxygen at home and I  suggested she might check with Dr. Sherril Croon or with a pulmonologist to get a  concentrator.  Her blood pressure was 163/70.  Pulse 77.  Respirations  20.  Sats were 88%.  Lungs were clear bilaterally.  She will be starting  her chemotherapy in the near future and then will probably get radiation  also.   Ines Bloomer, M.D.  Electronically Signed   DPB/MEDQ  D:  12/21/2006  T:  12/22/2006  Job:  [id number]   cc:   Doreen Beam

## 2010-05-20 NOTE — Letter (Signed)
July 19, 2007   Boris M. Darovsky, MD.  [address]  [place], [place] [address].   Re:  Heather Rosales, Heather Rosales             DOB:  September 21, 1930   Dear Dr. Ubaldo Glassing:   I saw the patient back today after doing a EBUS bronchoscopy on her for  mediastinal nodes and we really did not find any evidence of recurrent  cancer.  There also was no endobronchial cancer.  So, I feel that at  present time I cannot prove that she still has any residual cancer.  You  have a CT scan scheduled for her in September 2009, and I will see her  back again at that time if there are any changes, and we will definitely  consider repeating an open biopsy.  Her blood pressure was 140/70, pulse  75, respirations 18, and saturations were 90%.   Ines Bloomer, M.D.  Electronically Signed   DPB/MEDQ  D:  07/19/2007  T:  07/20/2007  Job:  [id number]

## 2010-05-23 NOTE — H&P (Signed)
NAME:  Heather Rosales, Heather Rosales                            ACCOUNT NO.:  [id number]   MEDICAL RECORD NO.:  [id number]                   PATIENT TYPE:  OIB   LOCATION:                                       FACILITY:  [facility]   PHYSICIAN:  Willa Rough, M.D.                  DATE OF BIRTH:  [birth year]   DATE OF ADMISSION:  10/20/2002  DATE OF DISCHARGE:                                HISTORY & PHYSICAL   IDENTIFYING INFORMATION:  She is admitted for same day heart catheterization  from our office in [place].   CHIEF COMPLAINT:  Shortness of breath with coronary calcification noted on  CT scan and positive nuclear study.   HISTORY OF PRESENT ILLNESS:  Very pleasant 77 year old female with a history  of COPD, hypertension, GERD, and unknown lipid status.  The patient was seen  as initial cardiac patient October 10, 2002, by Dr. Diona Browner.  For details  please see dictated note.  She has had dyspnea on exertion for a long time  but had been worse since June.  A CT scan of her chest reported some  atherosclerotic calcification of the coronary arteries.  EKG at rest was  nonspecific and she was set up for a Cardiolite study.   Cardiolite study was done which was clinically negative for ischemia.  electrocardiographically negative for ischemia with poor exercise tolerance.  The patient exercised for four minutes, four seconds, achieved a maximum  heart rate of 127 which was 84% of her maximum predicted heart rate,  achieved a workload of 5.9 mets.  Her perfusion data demonstrated moderate-  sized anterior, partially reversible defect as well as inferior, moderately  sized reversible defect.  These findings were felt to be consistent with  coronary artery disease and further evaluation with catheterization was felt  to be indicated.  On talking with Ms. Arpin today she has had no complaints  of chest discomfort but still has complaints of dyspnea on exertion which is  no better.  She would like to proceed  with elective coronary angiography.  She agrees and accepts the risks and benefits of the explained procedure  which include infection, bleeding, death, arrhythmia, and stroke.   ALLERGIES:  PENICILLIN.   REVIEW OF SYSTEMS:  Positive for shortness of breath, dyspnea on exertion,  and wheezing.  She denies any chronic cough.  She denies any GI problems or  any other musculoskeletal complaints.   PAST MEDICAL HISTORY:  1. Positive for COPD.  2. Hypertension.  3. GERD.  4. She is status post hysterectomy.  5. Tonsillectomy.  Lipid status is not known.   SOCIAL HISTORY:  She smokes seven cigarettes a day and has done so for many  years.  She does not drink alcohol.  She is married.  She has grown children  and I believe nine grandchildren.   FAMILY HISTORY:  Noncontributory.  There is no history of premature coronary  artery disease.   CURRENT MEDICATIONS:  1. Atenolol 50 mg q.p.m., 25 mg q.a.m.  2. Lotrel 5/20 daily.  3. Monistat 0.625 mg daily.   PHYSICAL EXAMINATION:  VITAL SIGNS:  Blood pressure is 152/70, pulse is 74  and regular, weight is 157.  GENERAL:  Shows a pleasant, short-stature white female in no acute distress.  HEENT:  Shows bilateral arcus senilis.  NECK:  Without JVD or carotid bruits.  LUNGS:  Show decreased breath sounds with some faint and expiratory  wheezing.  HEART:  Rhythm is regular and distant without any murmurs, rubs, or clicks.  ABDOMEN:  Soft, nontender.  Bowel sounds are present.  EXTREMITIES:  Without edema.  Pedal pulses are intact.   IMPRESSION:  1. Dyspnea on exertion with positive nuclear study compatible with ischemia.  2. Chronic obstructive pulmonary disease.  3. Hypertension.  4. Gastroesophageal reflux disease.  5. Unknown lipid status.  6. Continued tobacco abuse.   PLAN:  The patient will be admitted for elective heart catheterization.  Further recommendations pending acquisition of above.  Will also take the  liberty of  drawing a fasting lipid profile while she is at the hospital and  treat accordingly.           Suszanne Conners. Julious Oka.                    Willa Rough, M.D.    MRD/MEDQ  D:  10/18/2002  T:  10/18/2002  Job:  [id number]   cc:   Doreen Beam  [address]  [place]  [place] [address]  Fax: [phone]   Cherie Ouch  [address] Laurell Josephs. [place]  [place] [address]  Fax: [phone]

## 2010-05-23 NOTE — Cardiovascular Report (Signed)
Heather Rosales, Heather Rosales                             ACCOUNT NO.:  [id number]   MEDICAL RECORD NO.:  [id number]                   PATIENT TYPE:  OIB   LOCATION:  2899                                 FACILITY:  [facility]   PHYSICIAN:  Veneda Melter, M.D.                   DATE OF BIRTH:  27-Aug-1930   DATE OF PROCEDURE:  10/20/2002  DATE OF DISCHARGE:                              CARDIAC CATHETERIZATION   PROCEDURES PERFORMED:  1. Left heart catheterization.  2. Left ventriculogram.  3. Selective coronary angiography.   DIAGNOSES:  1. Coronary atherosclerotic disease.  2. Normal left ventricular systolic function.   HISTORY:  Heather Rosales is a 77 year old white female with obstructive lung  disease, hypertension, gastroesophageal reflux disease, who presents with  progressive shortness of breath.  The patient underwent stress imaging study  suggesting ischemia in the anterior and inferior walls.  She is referred for  further cardiac assessment.   TECHNIQUE:  Informed consent was obtained.  The patient was brought to the  catheterization laboratory.  A 6-French sheath was placed in the right  femoral artery using the modified Seldinger technique.  A 6-French JL4 and  JR4 catheter then used to engage the left and right coronary arteries and  selective angiography performed in various projections using manual  injections of contrast.  A 6-French pigtail catheter was then advanced to  the left ventricle and a left ventriculogram performed using power  injections of contrast.  At the termination of the case the catheters and  sheath were removed and manual pressure applied until adequate hemostasis  was achieved.  The patient tolerated the procedure well and was transferred  to the floor in stable condition.  Findings are as follows.   FINDINGS:  1. Left main trunk:  Large-caliber vessel with mild irregularities.  2. Left anterior descending:  Large-caliber vessel which supplies a small  first diagonal branch in the proximal segment and a large bifurcating     second diagonal branch in the mid section.  The LAD has diffuse     calcification and disease of 30% in the proximal segment.  There is     moderate narrowing in the mid section of 60%.  The diagonal branches have     mild disease of 30%.  3. Left circumflex artery:  This is a large-caliber vessel which supplies a     small first marginal branch in the mid section, a large second marginal     branch distally.  The AV circumflex has mild disease of 30%.  The first     marginal branch has proximal narrowing of 70%.  The second marginal     branch has proximal narrowing of 50%.  4. Right coronary artery:  Dominant.  This is a large-caliber vessel which     supplies the posterior descending artery and two posteroventricular  branches in the terminal segment.  The right coronary has diffuse disease     of 30% in the mid section.  5. Left ventricle:  Normal end-systolic and end-diastolic dimensions.     Overall left ventricular function is well preserved.  Ejection fraction     was greater than 55%.  No mitral regurgitation.  LV pressure was 140/10,     aorta was 140/70, LVEDP equals 20.   ASSESSMENT AND PLAN:  Heather Rosales is a 77 year old female with shortness of  breath.  She has a borderline lesion of the left anterior descending of  unclear significance as the patient had a suboptimal stress test.  I believe  repeating this in a different modality, perhaps dobutamine echo or  Cardiolite, would be prudent to rule out ischemia in the anterior wall.  Treatment strategies including percutaneous intervention versus percutaneous  coronary intervention may then be considered.                                               Veneda Melter, M.D.    NG/MEDQ  D:  10/20/2002  T:  10/20/2002  Job:  [id number]   cc:   Willa Rough, M.D.   Doreen Beam  [address]  [place]  [place] [address]  Fax: [phone]

## 2010-05-23 NOTE — Discharge Summary (Signed)
   NAME:  Heather Rosales, Heather Rosales                             ACCOUNT NO.:  [id number]   MEDICAL RECORD NO.:  [id number]                   PATIENT TYPE:  OIB   LOCATION:  2899                                 FACILITY:  [facility]   PHYSICIAN:  Jonelle Sidle, M.D. [facility]        DATE OF BIRTH:  August 18, 1930   DATE OF ADMISSION:  10/20/2002  DATE OF DISCHARGE:  10/20/2002                           DISCHARGE SUMMARY - REFERRING   [name] is a 77 year old female patient who was admitted for same day  heart catheterization from the [facility] office. She complains of dyspnea on  exertion but has worsened since June. Cardiolite study was negative for  ischemia and electrocardiographically negative for ischemic with poor  exercise tolerance or for symptoms. We proceeded with cardiac  catheterization, and she was found to have mild left main disease, LAD 60%,  mid left circumflex 70%, OM2 50%, OM2 RCA diffuse 30% stenosis; LV gram with  no MR; EF greater than 55%. At this point, Dr. Chales Abrahams felt she should undergo  a dobutamine echocardiogram to assess the anterior wall, and she does have a  followup appointment with Dr. Myrtis Ser in [place] on November 01, 2002 at 1:25 p.m.  The office will call the patient to set up a dobutamine echocardiogram in  [facility] which will be better for the patient and better for access for Dr.  Myrtis Ser.      Guy Franco, P.A. [facility]                      Jonelle Sidle, M.D. [facility]    LB/MEDQ  D:  10/20/2002  T:  10/20/2002  Job:  [id number]

## 2010-10-02 LAB — CBC
HCT: 34.2 — ABNORMAL LOW
Hemoglobin: 11.5 — ABNORMAL LOW
MCHC: 33.6
MCV: 89.5
RBC: 3.82 — ABNORMAL LOW

## 2010-10-02 LAB — COMPREHENSIVE METABOLIC PANEL
CO2: 33 — ABNORMAL HIGH
Calcium: 9.4
Chloride: 104
Creatinine, Ser: 0.72
GFR calc non Af Amer: >60
Glucose, Bld: 96
Total Bilirubin: 0.4

## 2010-10-02 LAB — APTT: aPTT: 36

## 2010-10-02 LAB — PROTIME-INR: Prothrombin Time: 12.6

## 2010-10-13 LAB — COMPREHENSIVE METABOLIC PANEL
AST: 17
Albumin: 3.5
Calcium: 9.6
Creatinine, Ser: 0.62
GFR calc Af Amer: >60
GFR calc non Af Amer: >60
Total Protein: 6.4

## 2010-10-13 LAB — CBC
MCHC: 33.9
MCV: 84
Platelets: 251
RDW: 16.6 — ABNORMAL HIGH

## 2010-10-13 LAB — APTT: aPTT: 30

## 2010-10-14 LAB — POCT I-STAT 3, ART BLOOD GAS (G3+)
Acid-Base Excess: 19 — ABNORMAL HIGH
Acid-Base Excess: 5 — ABNORMAL HIGH
Acid-Base Excess: 7 — ABNORMAL HIGH
Acid-Base Excess: 1
Acid-base deficit: 1
Acid-base deficit: 1
Bicarbonate: 27.1 — ABNORMAL HIGH
Bicarbonate: 31.2 — ABNORMAL HIGH
Bicarbonate: 31.5 — ABNORMAL HIGH
Bicarbonate: 31.9 — ABNORMAL HIGH
Bicarbonate: 32.4 — ABNORMAL HIGH
Bicarbonate: 39.4 — ABNORMAL HIGH
Bicarbonate: 45.2 — ABNORMAL HIGH
Bicarbonate: 26.3 — ABNORMAL HIGH
Bicarbonate: 26.5 — ABNORMAL HIGH
Bicarbonate: 31.7 — ABNORMAL HIGH
O2 Saturation: 89
O2 Saturation: 91
O2 Saturation: 92
O2 Saturation: 93
O2 Saturation: 94
O2 Saturation: 95
O2 Saturation: 100
O2 Saturation: 92
O2 Saturation: 93
O2 Saturation: 94
O2 Saturation: 94
Operator id: 260421
Operator id: 270211
Operator id: 270221
Operator id: 280981
Operator id: 280981
Operator id: 281271
Operator id: 282001
Operator id: 261491
Operator id: 264691
Patient temperature: 98.8
Patient temperature: 99.4
Patient temperature: 100.9
Patient temperature: 98.4
Patient temperature: 98.9
TCO2: 29
TCO2: 34
TCO2: 40
TCO2: 41
TCO2: 28
TCO2: 28
TCO2: 28
TCO2: 34
pCO2 arterial: 43.6
pCO2 arterial: 46.3 — ABNORMAL HIGH
pCO2 arterial: 46.8 — ABNORMAL HIGH
pCO2 arterial: 48.8 — ABNORMAL HIGH
pCO2 arterial: 55.7 — ABNORMAL HIGH
pCO2 arterial: 56.9 — ABNORMAL HIGH
pCO2 arterial: 60.3
pCO2 arterial: 60.7
pCO2 arterial: 65.5
pCO2 arterial: 66.3
pH, Arterial: 7.265 — ABNORMAL LOW
pH, Arterial: 7.35
pH, Arterial: 7.468 — ABNORMAL HIGH
pH, Arterial: 7.483 — ABNORMAL HIGH
pH, Arterial: 7.522 — ABNORMAL HIGH
pH, Arterial: 7.251 — ABNORMAL LOW
pH, Arterial: 7.273 — ABNORMAL LOW
pH, Arterial: 7.286 — ABNORMAL LOW
pO2, Arterial: 56 — ABNORMAL LOW
pO2, Arterial: 56 — ABNORMAL LOW
pO2, Arterial: 68 — ABNORMAL LOW
pO2, Arterial: 71 — ABNORMAL LOW
pO2, Arterial: 73 — ABNORMAL LOW
pO2, Arterial: 76 — ABNORMAL LOW
pO2, Arterial: 236 — ABNORMAL HIGH
pO2, Arterial: 71 — ABNORMAL LOW
pO2, Arterial: 72 — ABNORMAL LOW
pO2, Arterial: 75 — ABNORMAL LOW

## 2010-10-14 LAB — BASIC METABOLIC PANEL
BUN: 10
BUN: 10
BUN: 12
BUN: 14
BUN: 6
BUN: 9
BUN: 9
BUN: 14
BUN: 20
BUN: 26 — ABNORMAL HIGH
CO2: 31
CO2: 35 — ABNORMAL HIGH
CO2: 37 — ABNORMAL HIGH
CO2: 38 — ABNORMAL HIGH
CO2: 26
CO2: 26
CO2: 27
Calcium: 8.2 — ABNORMAL LOW
Calcium: 8.3 — ABNORMAL LOW
Calcium: 8.3 — ABNORMAL LOW
Calcium: 8.3 — ABNORMAL LOW
Calcium: 8.4
Calcium: 8.4
Calcium: 9
Calcium: 9.1
Calcium: 7.8 — ABNORMAL LOW
Calcium: 8 — ABNORMAL LOW
Chloride: 101
Chloride: 112
Chloride: 91 — ABNORMAL LOW
Chloride: 95 — ABNORMAL LOW
Chloride: 95 — ABNORMAL LOW
Chloride: 109
Chloride: 110
Creatinine, Ser: 0.52
Creatinine, Ser: 0.56
Creatinine, Ser: 0.57
Creatinine, Ser: 0.58
Creatinine, Ser: 0.59
Creatinine, Ser: 0.6
Creatinine, Ser: 0.6
Creatinine, Ser: 0.62
Creatinine, Ser: 0.64
Creatinine, Ser: 0.75
Creatinine, Ser: 0.97
Creatinine, Ser: 0.97
GFR calc Af Amer: >60
GFR calc Af Amer: >60
GFR calc Af Amer: >60
GFR calc Af Amer: >60
GFR calc Af Amer: >60
GFR calc Af Amer: >60
GFR calc Af Amer: >60
GFR calc non Af Amer: >60
GFR calc non Af Amer: >60
GFR calc non Af Amer: >60
GFR calc non Af Amer: >60
GFR calc non Af Amer: >60
GFR calc non Af Amer: >60
GFR calc non Af Amer: >60
Glucose, Bld: 117 — ABNORMAL HIGH
Glucose, Bld: 143 — ABNORMAL HIGH
Glucose, Bld: 147 — ABNORMAL HIGH
Glucose, Bld: 163 — ABNORMAL HIGH
Glucose, Bld: 183 — ABNORMAL HIGH
Glucose, Bld: 50 — ABNORMAL LOW
Glucose, Bld: 78
Glucose, Bld: 91
Glucose, Bld: 112 — ABNORMAL HIGH
Glucose, Bld: 129 — ABNORMAL HIGH
Potassium: 3.4 — ABNORMAL LOW
Potassium: 3.6
Potassium: 3.8
Potassium: 4.1
Potassium: 4.2
Potassium: 3.9
Sodium: 129 — ABNORMAL LOW
Sodium: 136
Sodium: 138
Sodium: 138
Sodium: 141
Sodium: 146 — ABNORMAL HIGH

## 2010-10-14 LAB — CBC
HCT: 28.8 — ABNORMAL LOW
HCT: 29 — ABNORMAL LOW
HCT: 31.4 — ABNORMAL LOW
HCT: 31.6 — ABNORMAL LOW
HCT: 32.8 — ABNORMAL LOW
HCT: 33.6 — ABNORMAL LOW
HCT: 35 — ABNORMAL LOW
HCT: 29 — ABNORMAL LOW
Hemoglobin: 10.3 — ABNORMAL LOW
Hemoglobin: 10.9 — ABNORMAL LOW
Hemoglobin: 9.5 — ABNORMAL LOW
Hemoglobin: 12.4
Hemoglobin: 7.6 — CL
MCHC: 33
MCHC: 33.1
MCHC: 33.2
MCHC: 33.3
MCHC: 32.5
MCHC: 32.7
MCHC: 32.8
MCHC: 33.1
MCV: 84.9
MCV: 85.3
MCV: 85.7
MCV: 85.8
MCV: 85.8
MCV: 85.9
MCV: 82.6
MCV: 83.9
MCV: 84.3
Platelets: 172
Platelets: 254
Platelets: 264
Platelets: 269
Platelets: 276
Platelets: 295
Platelets: 303
Platelets: 162
Platelets: 164
Platelets: 206
RBC: 3.3 — ABNORMAL LOW
RBC: 3.38 — ABNORMAL LOW
RBC: 3.4 — ABNORMAL LOW
RBC: 3.63 — ABNORMAL LOW
RBC: 3.78 — ABNORMAL LOW
RBC: 4.13
RBC: 4.14
RBC: 4.53
RDW: 16.5 — ABNORMAL HIGH
RDW: 16.6 — ABNORMAL HIGH
RDW: 16.8 — ABNORMAL HIGH
RDW: 16.8 — ABNORMAL HIGH
RDW: 17.1 — ABNORMAL HIGH
RDW: 17.2 — ABNORMAL HIGH
RDW: 16.7 — ABNORMAL HIGH
RDW: 17.1 — ABNORMAL HIGH
WBC: 11.6 — ABNORMAL HIGH
WBC: 13.2 — ABNORMAL HIGH
WBC: 13.6 — ABNORMAL HIGH
WBC: 14.4 — ABNORMAL HIGH
WBC: 7.6
WBC: 7.8
WBC: 8.4
WBC: 9
WBC: 9.7
WBC: 14.5 — ABNORMAL HIGH
WBC: 17.7 — ABNORMAL HIGH

## 2010-10-14 LAB — COMPREHENSIVE METABOLIC PANEL
ALT: 64 — ABNORMAL HIGH
AST: 13
AST: 28
AST: 52 — ABNORMAL HIGH
Albumin: 2 — ABNORMAL LOW
Albumin: 2.3 — ABNORMAL LOW
Alkaline Phosphatase: 43
Alkaline Phosphatase: 47
BUN: 12
CO2: 33 — ABNORMAL HIGH
Chloride: 111
Chloride: 94 — ABNORMAL LOW
Chloride: 102
Creatinine, Ser: 0.5
Creatinine, Ser: 0.69
GFR calc Af Amer: >60
GFR calc Af Amer: >60
GFR calc non Af Amer: >60
Glucose, Bld: 144 — ABNORMAL HIGH
Potassium: 3.5
Potassium: 3.8
Total Bilirubin: 0.5
Total Bilirubin: 0.9
Total Bilirubin: 0.6
Total Protein: 4.7 — ABNORMAL LOW

## 2010-10-14 LAB — AFB CULTURE WITH SMEAR (NOT AT ARMC)
Acid Fast Smear: NONE SEEN
Acid Fast Smear: NONE SEEN

## 2010-10-14 LAB — CROSSMATCH

## 2010-10-14 LAB — DIFFERENTIAL
Basophils Absolute: 0
Eosinophils Absolute: 0.1 — ABNORMAL LOW
Eosinophils Relative: 1
Lymphocytes Relative: 4 — ABNORMAL LOW
Lymphs Abs: 0.5 — ABNORMAL LOW
Neutrophils Relative %: 91 — ABNORMAL HIGH

## 2010-10-14 LAB — CULTURE, RESPIRATORY W GRAM STAIN

## 2010-10-14 LAB — MAGNESIUM
Magnesium: 2
Magnesium: 2.1
Magnesium: 1.8
Magnesium: 2

## 2010-10-14 LAB — URINE CULTURE
Colony Count: NO GROWTH
Culture: NO GROWTH
Special Requests: NEGATIVE

## 2010-10-14 LAB — CULTURE, BLOOD (ROUTINE X 2): Culture: NO GROWTH

## 2010-10-14 LAB — COMPLEMENT, TOTAL: Compl, Total (CH50): 64 U/mL (ref 31–66)

## 2010-10-14 LAB — HIGH SENSITIVITY CRP: CRP, High Sensitivity: 12.7 — ABNORMAL HIGH

## 2010-10-14 LAB — C3 COMPLEMENT: C3 Complement: 114

## 2010-10-14 LAB — BASIC METABOLIC PANEL WITH GFR
BUN: 18
CO2: 28
Calcium: 8 — ABNORMAL LOW
Chloride: 116 — ABNORMAL HIGH
Creatinine, Ser: 0.66
GFR calc non Af Amer: >60
Glucose, Bld: 121 — ABNORMAL HIGH
Potassium: 4.2
Sodium: 148 — ABNORMAL HIGH

## 2010-10-14 LAB — FUNGUS CULTURE W SMEAR: Fungal Smear: NONE SEEN

## 2010-10-14 LAB — FOLATE: Folate: >20

## 2010-10-14 LAB — ANTI-NUCLEAR AB-TITER (ANA TITER)

## 2010-10-14 LAB — VITAMIN B12: Vitamin B-12: 251 (ref 211–911)

## 2010-10-14 LAB — URINALYSIS, ROUTINE W REFLEX MICROSCOPIC
Glucose, UA: NEGATIVE
Nitrite: NEGATIVE
Specific Gravity, Urine: 1.01
pH: 7.5

## 2010-10-14 LAB — CARDIAC PANEL(CRET KIN+CKTOT+MB+TROPI)
CK, MB: 1.4
Total CK: 70
Troponin I: 0.07 — ABNORMAL HIGH

## 2010-10-14 LAB — PREPARE RBC (CROSSMATCH)

## 2010-10-14 LAB — AMYLASE, BODY FLUID

## 2010-10-14 LAB — LACTATE DEHYDROGENASE: LDH: 167

## 2010-10-14 LAB — MPO/PR-3 (ANCA) ANTIBODIES: Serine Protease 3: <3.5 U/mL (ref <3.5)

## 2010-10-14 LAB — GLUCOSE, SEROUS FLUID: Glucose, Fluid: 109

## 2010-10-14 LAB — CARBOXYHEMOGLOBIN
Methemoglobin: 0.7
Total hemoglobin: 9 — ABNORMAL LOW

## 2010-10-14 LAB — BODY FLUID CULTURE: Culture: NO GROWTH

## 2010-10-14 LAB — LACTATE DEHYDROGENASE, PLEURAL OR PERITONEAL FLUID: LD, Fluid: 225 — ABNORMAL HIGH

## 2010-10-14 LAB — B-NATRIURETIC PEPTIDE (CONVERTED LAB): Pro B Natriuretic peptide (BNP): 202 — ABNORMAL HIGH

## 2010-10-14 LAB — ANTI-NEUTROPHIL ANTIBODY: Cytoplasmic Neutrophilic Ab: <1:20 {titer}

## 2010-10-14 LAB — PHOSPHORUS
Phosphorus: 3.3
Phosphorus: 1.8 — ABNORMAL LOW
Phosphorus: 2.6

## 2010-10-14 LAB — RETICULOCYTES
RBC.: 3.8 — ABNORMAL LOW
Retic Count, Absolute: 34.2
Retic Ct Pct: 0.9

## 2010-10-14 LAB — C4 COMPLEMENT: Complement C4, Body Fluid: 18

## 2010-10-14 LAB — FERRITIN: Ferritin: 250 (ref 10–291)

## 2010-10-14 LAB — LACTIC ACID, PLASMA: Lactic Acid, Venous: 0.8

## 2010-10-14 LAB — PH, BODY FLUID: pH, Fluid: 7.69

## 2010-10-14 LAB — D-DIMER, QUANTITATIVE: D-Dimer, Quant: 3.02 — ABNORMAL HIGH

## 2011-01-07 ENCOUNTER — Encounter (INDEPENDENT_AMBULATORY_CARE_PROVIDER_SITE_OTHER): Payer: Self-pay | Admitting: *Deleted

## 2011-01-07 ENCOUNTER — Encounter (INDEPENDENT_AMBULATORY_CARE_PROVIDER_SITE_OTHER): Payer: Self-pay | Admitting: Internal Medicine

## 2011-01-07 ENCOUNTER — Other Ambulatory Visit (INDEPENDENT_AMBULATORY_CARE_PROVIDER_SITE_OTHER): Payer: Self-pay | Admitting: *Deleted

## 2011-01-07 ENCOUNTER — Telehealth (INDEPENDENT_AMBULATORY_CARE_PROVIDER_SITE_OTHER): Payer: Self-pay | Admitting: *Deleted

## 2011-01-07 ENCOUNTER — Ambulatory Visit (INDEPENDENT_AMBULATORY_CARE_PROVIDER_SITE_OTHER): Payer: Medicare Other | Admitting: Internal Medicine

## 2011-01-07 DIAGNOSIS — K219 Gastro-esophageal reflux disease without esophagitis: Secondary | ICD-10-CM

## 2011-01-07 DIAGNOSIS — K625 Hemorrhage of anus and rectum: Secondary | ICD-10-CM | POA: Insufficient documentation

## 2011-01-07 DIAGNOSIS — D649 Anemia, unspecified: Secondary | ICD-10-CM | POA: Insufficient documentation

## 2011-01-07 MED ORDER — BISACODYL-PEG-KCL-NABICAR-NACL 5-210 MG-GM PO KIT: 1.0000 | PACK | Freq: Once | ORAL | Status: DC

## 2011-01-07 NOTE — Patient Instructions (Signed)
Will schedule a colonoscopy with Dr. Karilyn Cota in the near future.

## 2011-01-07 NOTE — Progress Notes (Signed)
Subjective:     Patient ID: Heather Rosales, female   DOB: January 10, 1930, 78 y.o.   MRN: [id number]  HPI  Heather Rosales is a 78 yr old female referred to our office by Dr. Ubaldo Glassing for rectal bleeding per patient  and hx of iron deficiency anemia.  She has had seen blood in her stools for a couple of months.  She has a hx of squamous cell lung carcinoma carcinoma, initially clinical stage IIIB, who is status post comibined modality chemoradiation therapy.  Diagnosed in 2008.  Her last Hemoglobin  11.8 , MCV 78, Iron 26, Saturation 7, TIBC 351, Ferritin 29 on 12/31/2010 per daughter who is present i room.  She has a hx of anemia x 1 yr.   Appetite is not good.  Her weight on 01/01/2011 was 136 at Dr. Ubaldo Glassing. Today her weight is 136. (This may  Usually has a BM 1 every 2-3 days.  02/26/2010 EGD 1. Focal scarring with mucosal irregularity at proximal esophagus,  felt to be radiation-induced injury, but no evidence of ulcer or  mass or stricture.  2. No evidence of erosive esophagitis or Barrett's esophagus.  3. No evidence of pyloric stenosis or peptic ulcer disease.  4. Given endoscopic findings, I am not convinced that her throat  symptoms are primarily due to gastroesophageal reflux disease.  However, I would like to make sure, she does not have gastroparesis  Her last colonoscopy was 3 yrs ago by Dr. Cleotis Nipper and was normal.   Medications are from Dr. Ubaldo Glassing dictated notes on 01/01/2011. Patient did not have medications with her nor a list.    Review of Systems see hpi Current Outpatient Prescriptions  Medication Sig Dispense Refill  . amLODipine (NORVASC) 10 MG tablet Take by mouth daily.        Marland Kitchen atenolol (TENORMIN) 25 MG tablet Take by mouth daily.        . benazepril (LOTENSIN) 10 MG tablet Take by mouth daily.        . calcium carbonate (OS-CAL) 1250 MG chewable tablet Chew 1 tablet by mouth daily.        Marland Kitchen levothyroxine (SYNTHROID, LEVOTHROID) 100 MCG tablet Take by mouth daily.        Marland Kitchen  omeprazole (PRILOSEC) 10 MG capsule Take by mouth daily.        Marland Kitchen oxybutynin (OXYTROL) 3.9 MG/24HR Place 1 patch onto the skin.        Marland Kitchen tiotropium (SPIRIVA) 18 MCG inhalation capsule Place into inhaler and inhale daily.         No current outpatient prescriptions on file prior to visit.   Past Medical History  Diagnosis Date  . Cancer   . Hypertension   . Throat cancer     2008  . Lung cancer     2008  . GERD (gastroesophageal reflux disease)   . Alzheimer disease    Past Surgical History  Procedure Date  . Cholecystectomy   . Total abdominal hysterectomy   . Tonsillectomy    Family Status  Relation Status Death Age  . Mother Deceased     Cancer  . Father Deceased     cancer  . Sister Deceased     One had lymphoma and one unknown.  . Brother Deceased     Lung Cancer   History   Social History  . Marital Status: Married    Spouse Name: N/A    Number of Children: N/A  . Years of Education: N/A  Occupational History  . Not on file.   Social History Main Topics  . Smoking status: Current Everyday Smoker  . Smokeless tobacco: Not on file   Comment: 3-4 cigarettes a day x over 50 yrs  . Alcohol Use: No  . Drug Use: No  . Sexually Active: Not on file   Other Topics Concern  . Not on file   Social History Narrative  . No narrative on file   History   Social History Narrative  . No narrative on file   Allergies  Allergen Reactions  . Penicillins      Be advised patient did not bring her medications in today. Medications obtained ffrom Dr. Saintclair Halsted note from the 01/01/2011    Objective:   Physical Exam Filed Vitals:   01/07/11 1536  BP: 146/60  Pulse: 66  Temp: 98.2 F (36.8 C)  Height: 5' 1.5" (1.562 m)  Weight: 136 lb 6.4 oz (61.871 kg)    Alert and oriented. Skin warm and dry. Oral mucosa is moist. Natural teeth in good condition. Sclera anicteric, conjunctivae is pink. Thyroid not enlarged. No cervical lymphadenopathy. Lungs clear.  Heart regular rate and rhythm.  Abdomen is soft. Bowel sounds are positive. No hepatomegaly. No abdominal masses felt. No tenderness.  No edema to lower extremities. Patient is alert and oriented.  She was guaiac negative today in the office.     Assessment:     Rectal bleeding Colonic neoplasm needs to be ruled out.     Plan:   Will schedule a colonoscopy with Dr. Karilyn Cota. The risks and benefits such as perforation, bleeding, and infection were reviewed with the patient and is agreeable.

## 2011-01-07 NOTE — Telephone Encounter (Signed)
Patient needs half lytely 

## 2011-01-19 ENCOUNTER — Encounter (HOSPITAL_COMMUNITY): Payer: Self-pay | Admitting: Pharmacy Technician

## 2011-01-27 MED ORDER — SODIUM CHLORIDE 0.45 % IV SOLN
Freq: Once | INTRAVENOUS | Status: AC
  Administered 2011-01-28: 1000 mL via INTRAVENOUS

## 2011-01-28 ENCOUNTER — Other Ambulatory Visit (INDEPENDENT_AMBULATORY_CARE_PROVIDER_SITE_OTHER): Payer: Self-pay | Admitting: Internal Medicine

## 2011-01-28 ENCOUNTER — Encounter (HOSPITAL_COMMUNITY): Payer: Self-pay | Admitting: *Deleted

## 2011-01-28 ENCOUNTER — Ambulatory Visit (HOSPITAL_COMMUNITY)
Admission: RE | Admit: 2011-01-28 | Discharge: 2011-01-28 | Disposition: A | Discharge status: Home / Self Care | Payer: Medicare Other | Source: Ambulatory Visit | Attending: Internal Medicine | Admitting: Internal Medicine

## 2011-01-28 ENCOUNTER — Encounter (HOSPITAL_COMMUNITY)
Admission: RE | Disposition: A | Discharge status: Home / Self Care | Payer: Self-pay | Source: Ambulatory Visit | Attending: Internal Medicine

## 2011-01-28 DIAGNOSIS — K648 Other hemorrhoids: Secondary | ICD-10-CM | POA: Insufficient documentation

## 2011-01-28 DIAGNOSIS — Q2733 Arteriovenous malformation of digestive system vessel: Secondary | ICD-10-CM

## 2011-01-28 DIAGNOSIS — K5730 Diverticulosis of large intestine without perforation or abscess without bleeding: Secondary | ICD-10-CM | POA: Insufficient documentation

## 2011-01-28 DIAGNOSIS — K625 Hemorrhage of anus and rectum: Secondary | ICD-10-CM

## 2011-01-28 DIAGNOSIS — D128 Benign neoplasm of rectum: Secondary | ICD-10-CM | POA: Insufficient documentation

## 2011-01-28 DIAGNOSIS — D129 Benign neoplasm of anus and anal canal: Secondary | ICD-10-CM | POA: Insufficient documentation

## 2011-01-28 DIAGNOSIS — R1310 Dysphagia, unspecified: Secondary | ICD-10-CM | POA: Insufficient documentation

## 2011-01-28 DIAGNOSIS — D509 Iron deficiency anemia, unspecified: Secondary | ICD-10-CM | POA: Insufficient documentation

## 2011-01-28 DIAGNOSIS — K626 Ulcer of anus and rectum: Secondary | ICD-10-CM

## 2011-01-28 DIAGNOSIS — K219 Gastro-esophageal reflux disease without esophagitis: Secondary | ICD-10-CM

## 2011-01-28 HISTORY — PX: COLONOSCOPY: SHX5424

## 2011-01-28 HISTORY — PX: ESOPHAGOGASTRODUODENOSCOPY: SHX5428

## 2011-01-28 SURGERY — COLONOSCOPY
Anesthesia: Moderate Sedation

## 2011-01-28 MED ORDER — MEPERIDINE HCL 50 MG/ML IJ SOLN
INTRAMUSCULAR | Status: AC
  Filled 2011-01-28: qty 1

## 2011-01-28 MED ORDER — MEPERIDINE HCL 50 MG/ML IJ SOLN
INTRAMUSCULAR | Status: DC | PRN
  Administered 2011-01-28 (×2): 25 mg via INTRAVENOUS

## 2011-01-28 MED ORDER — HYDROCORTISONE ACETATE 25 MG RE SUPP: 25.0000 mg | Freq: Every day | RECTAL | Status: AC

## 2011-01-28 MED ORDER — BUTAMBEN-TETRACAINE-BENZOCAINE 2-2-14 % EX AERO
INHALATION_SPRAY | CUTANEOUS | Status: DC | PRN
  Administered 2011-01-28: 1 via TOPICAL

## 2011-01-28 MED ORDER — MIDAZOLAM HCL 5 MG/5ML IJ SOLN
INTRAMUSCULAR | Status: DC | PRN
  Administered 2011-01-28: 1 mg via INTRAVENOUS
  Administered 2011-01-28 (×2): 2 mg via INTRAVENOUS
  Administered 2011-01-28: 1 mg via INTRAVENOUS

## 2011-01-28 MED ORDER — MIDAZOLAM HCL 5 MG/5ML IJ SOLN
INTRAMUSCULAR | Status: AC
  Filled 2011-01-28: qty 10

## 2011-01-28 NOTE — H&P (Signed)
This is an update to H&P from 01/08/2011. Patient has IDA and will undergo both EGD and Colonoscopy. She does complain of intermittent dysphagia and worsening hoarseness.

## 2011-01-28 NOTE — Op Note (Signed)
EGD PROCEDURE REPORT  PATIENT:  Heather Rosales  MR#:  [id number] Birthdate:  [birth year], 78 y.o., female Endoscopist:  Dr. Malissa Hippo, MD Referred By:  Dr. Earma Reading, M.D. Procedure Date: 01/28/2011  Procedure:   EGD & Colonoscopy  Indications:  Patient is 78 year old Caucasian female who has chronic GERD maintained on PPI who complains of intermittent solid food dysphagia who was found to have iron deficiency anemia. She is undergoing diagnostic EGD and a colonoscopy and possibly esophageal dilation.            Informed Consent: Both the procedures and the risk of even the patient and informed consent was obtained. Medications:  Demerol 50 mg IV Versed 6 mg IV Cetacaine spray topically for oropharyngeal anesthesia  EGD  Description of procedure:  The endoscope was introduced through the mouth and advanced to the second portion of the duodenum without difficulty or limitations. The mucosal surfaces were surveyed very carefully during advancement of the scope and upon withdrawal.  Findings:  Esophagus:  Mucosa of the esophagus was normal. There was no obvious ring or stricture. GEJ:  39 cm Stomach:  Stomach was NT and distended very well with insufflation. Folds in the proximal stomach were normal. Examination of mucosa gastric body, antrum, pyloric channel as well as angularis fundus and cardia was normal. Duodenum:  Bulbar mucosa was normal. Similarly post bulbar mucosa and folds were normal with exception of single nonbleeding AV malformation.  Therapeutic/Diagnostic Maneuvers Performed:  Esophagus was dilated by passing 48 [place] to full insertion. esophageal mucosa was reexamined post dilation and no mucosal disruption was noted.  COLONOSCOPY Description of procedure:  After a digital rectal exam was performed, that colonoscope was advanced from the anus through the rectum and and distal sigmoid colon. Sigmoid colon was very tortuous and noncompliant with multiple diverticula  some quite large. Patient had to be turned on the right side in order to advance the scope. Scope was finally passed into cecum which is identified by appendiceal orifice and ileocecal valve. Show the taken for the record.  Findings:   Prep fair. She had pieces of formed stool scattered in the sigmoid and descending colon. Multiple diverticula noted throughout the colon but predominantly in the descending and sigmoid colon. 2 small rectal polyps were ablated via cold biopsy and submitted in one container. Ulcerated lesion above the dentate line possibly relapsing internal hemorrhoids. Biopsy taken to rule out adenoma.  Therapeutic/Diagnostic Maneuvers Performed:  See above  Complications:  None  Cecal Withdrawal Time:  22 minutes  Impression:  Small nonbleeding duodenal AV malformation otherwise normal EGD. Esophagus dilated by passing 48 [place] Maloney dilator given history of dysphagia and no mucosal disruption induced. Pan colonic diverticulosis with most of the diverticula located in sigmoid and descending colon. 2 small rectal polyps ablated by cold biopsy and submitted in one container. Ulcerated pseudomass and distal rectum most likely due to intermittently prolapsing internal hemorrhoid. Biopsy taken to rule out adenoma.   Recommendations:  Standard instructions given. Anusol HC suppository one per rectum at bedtime for 2 weeks. I would be contacting patient with results of biopsy and further recommendations.  REHMAN,NAJEEB U  01/28/2011 11:27 AM  CC: Dr. Ignatius Specking., MD, MD &  Dr.DAROVSKY,BORIS, MD.

## 2011-02-02 ENCOUNTER — Telehealth (INDEPENDENT_AMBULATORY_CARE_PROVIDER_SITE_OTHER): Payer: Self-pay | Admitting: *Deleted

## 2011-02-02 NOTE — Telephone Encounter (Signed)
Heather Rosales's daughter Mammie Lorenzo) would like for you to call about results of biopsy, her mom has memory problems and can't remember what you told her, Brenda's # [phone] or cell# [phone]

## 2011-02-03 ENCOUNTER — Encounter (HOSPITAL_COMMUNITY): Payer: Self-pay | Admitting: Internal Medicine

## 2011-02-04 ENCOUNTER — Telehealth (INDEPENDENT_AMBULATORY_CARE_PROVIDER_SITE_OTHER): Payer: Self-pay | Admitting: *Deleted

## 2011-02-04 DIAGNOSIS — D649 Anemia, unspecified: Secondary | ICD-10-CM

## 2011-02-04 NOTE — Telephone Encounter (Signed)
Per Dr. Karilyn Cota the patient will need a H/H in 2 weeks. This has been noted and the order will be faxed to [facility] lab in [place].

## 2011-02-07 NOTE — Telephone Encounter (Signed)
Unable to contact patient's daughter Steward Drone despite multiple attempts.

## 2011-02-11 ENCOUNTER — Encounter (INDEPENDENT_AMBULATORY_CARE_PROVIDER_SITE_OTHER): Payer: Self-pay | Admitting: *Deleted

## 2011-02-18 ENCOUNTER — Other Ambulatory Visit (INDEPENDENT_AMBULATORY_CARE_PROVIDER_SITE_OTHER): Payer: Self-pay | Admitting: Internal Medicine

## 2011-02-23 ENCOUNTER — Telehealth (INDEPENDENT_AMBULATORY_CARE_PROVIDER_SITE_OTHER): Payer: Self-pay | Admitting: *Deleted

## 2011-02-23 DIAGNOSIS — D649 Anemia, unspecified: Secondary | ICD-10-CM

## 2011-02-23 NOTE — Telephone Encounter (Signed)
Per Dr. Karilyn Cota the patient will need H/H in 2 months.

## 2011-03-27 ENCOUNTER — Encounter (INDEPENDENT_AMBULATORY_CARE_PROVIDER_SITE_OTHER): Payer: Self-pay | Admitting: *Deleted

## 2011-04-30 ENCOUNTER — Other Ambulatory Visit (INDEPENDENT_AMBULATORY_CARE_PROVIDER_SITE_OTHER): Payer: Self-pay | Admitting: Internal Medicine

## 2011-05-01 LAB — HEMOGLOBIN AND HEMATOCRIT, BLOOD: HCT: 38 % (ref 36.0–46.0)

## 2011-05-07 ENCOUNTER — Encounter (INDEPENDENT_AMBULATORY_CARE_PROVIDER_SITE_OTHER): Payer: Self-pay

## 2011-06-18 ENCOUNTER — Other Ambulatory Visit (HOSPITAL_COMMUNITY): Payer: Self-pay | Admitting: Internal Medicine

## 2011-06-18 ENCOUNTER — Encounter: Payer: Medicare Other | Admitting: Internal Medicine

## 2011-06-18 DIAGNOSIS — C3490 Malignant neoplasm of unspecified part of unspecified bronchus or lung: Secondary | ICD-10-CM

## 2011-06-18 DIAGNOSIS — K219 Gastro-esophageal reflux disease without esophagitis: Secondary | ICD-10-CM

## 2011-06-18 DIAGNOSIS — J449 Chronic obstructive pulmonary disease, unspecified: Secondary | ICD-10-CM

## 2011-06-18 DIAGNOSIS — D649 Anemia, unspecified: Secondary | ICD-10-CM

## 2011-06-29 ENCOUNTER — Encounter: Payer: Medicare Other | Admitting: Hematology and Oncology

## 2011-06-29 DIAGNOSIS — I10 Essential (primary) hypertension: Secondary | ICD-10-CM

## 2011-06-29 DIAGNOSIS — J449 Chronic obstructive pulmonary disease, unspecified: Secondary | ICD-10-CM

## 2011-06-29 DIAGNOSIS — C3490 Malignant neoplasm of unspecified part of unspecified bronchus or lung: Secondary | ICD-10-CM

## 2011-08-24 ENCOUNTER — Encounter (HOSPITAL_COMMUNITY): Payer: Self-pay

## 2011-08-24 ENCOUNTER — Encounter (HOSPITAL_COMMUNITY)
Admission: RE | Admit: 2011-08-24 | Discharge: 2011-08-24 | Disposition: A | Discharge status: Home / Self Care | Payer: Medicare Other | Source: Ambulatory Visit | Attending: Internal Medicine | Admitting: Internal Medicine

## 2011-08-24 DIAGNOSIS — Z9089 Acquired absence of other organs: Secondary | ICD-10-CM | POA: Insufficient documentation

## 2011-08-24 DIAGNOSIS — C3490 Malignant neoplasm of unspecified part of unspecified bronchus or lung: Secondary | ICD-10-CM

## 2011-08-24 DIAGNOSIS — I2510 Atherosclerotic heart disease of native coronary artery without angina pectoris: Secondary | ICD-10-CM | POA: Insufficient documentation

## 2011-08-24 DIAGNOSIS — I700 Atherosclerosis of aorta: Secondary | ICD-10-CM | POA: Insufficient documentation

## 2011-08-24 DIAGNOSIS — K5730 Diverticulosis of large intestine without perforation or abscess without bleeding: Secondary | ICD-10-CM | POA: Insufficient documentation

## 2011-08-24 DIAGNOSIS — I517 Cardiomegaly: Secondary | ICD-10-CM | POA: Insufficient documentation

## 2011-08-24 MED ORDER — FLUDEOXYGLUCOSE F - 18 (FDG) INJECTION
19.6000 | Freq: Once | INTRAVENOUS | Status: AC | PRN
  Administered 2011-08-24: 19.6 via INTRAVENOUS

## 2011-08-26 ENCOUNTER — Encounter: Payer: Medicare Other | Admitting: Internal Medicine

## 2011-08-26 DIAGNOSIS — J449 Chronic obstructive pulmonary disease, unspecified: Secondary | ICD-10-CM

## 2011-08-26 DIAGNOSIS — C3490 Malignant neoplasm of unspecified part of unspecified bronchus or lung: Secondary | ICD-10-CM

## 2011-09-09 ENCOUNTER — Encounter: Payer: Self-pay | Admitting: Radiation Oncology

## 2011-09-10 ENCOUNTER — Ambulatory Visit
Admission: RE | Admit: 2011-09-10 | Discharge: 2011-09-10 | Disposition: A | Discharge status: Home / Self Care | Payer: Medicare Other | Source: Ambulatory Visit | Attending: Radiation Oncology | Admitting: Radiation Oncology

## 2011-09-10 VITALS — BP 155/76 | HR 62 | Temp 97.9°F | Resp 20 | Ht 62.0 in | Wt 131.4 lb

## 2011-09-10 DIAGNOSIS — C3490 Malignant neoplasm of unspecified part of unspecified bronchus or lung: Secondary | ICD-10-CM

## 2011-09-10 DIAGNOSIS — C3410 Malignant neoplasm of upper lobe, unspecified bronchus or lung: Secondary | ICD-10-CM | POA: Insufficient documentation

## 2011-09-10 DIAGNOSIS — Z510 Encounter for antineoplastic radiation therapy: Secondary | ICD-10-CM | POA: Insufficient documentation

## 2011-09-10 NOTE — Progress Notes (Addendum)
Pt for ct sim chest, SBRT radiation. Pt denies pain, cough, sob, loss of appetite. She does use home O2 prn.

## 2011-09-10 NOTE — Progress Notes (Signed)
Met with patient to discuss RO billing.  Dx: 162.9 Lung, NOS  Attending Rad: Dr. Sanjuana Letters Tx: SRS Robot x5

## 2011-09-10 NOTE — Addendum Note (Signed)
Encounter addended by: Glennie Hawk, RN on: 09/10/2011  8:38 AM<BR>     Documentation filed: Vitals Section, Notes Section

## 2011-09-10 NOTE — Progress Notes (Signed)
Please see the Nurse Progress Note in the MD Initial Consult Encounter for this patient. 

## 2011-09-10 NOTE — Addendum Note (Signed)
Encounter addended by: Glennie Hawk, RN on: 09/10/2011  8:42 AM<BR>     Documentation filed: Visit Diagnoses, Notes Section

## 2011-09-21 ENCOUNTER — Ambulatory Visit: Payer: Medicare Other | Admitting: Radiation Oncology

## 2011-09-22 ENCOUNTER — Ambulatory Visit
Admission: RE | Admit: 2011-09-22 | Discharge: 2011-09-22 | Disposition: A | Discharge status: Home / Self Care | Payer: Medicare Other | Source: Ambulatory Visit | Attending: Radiation Oncology | Admitting: Radiation Oncology

## 2011-09-22 DIAGNOSIS — C3410 Malignant neoplasm of upper lobe, unspecified bronchus or lung: Secondary | ICD-10-CM

## 2011-09-23 ENCOUNTER — Encounter: Payer: Self-pay | Admitting: Radiation Oncology

## 2011-09-23 ENCOUNTER — Ambulatory Visit: Payer: Medicare Other | Admitting: Radiation Oncology

## 2011-09-24 ENCOUNTER — Ambulatory Visit
Admission: RE | Admit: 2011-09-24 | Discharge: 2011-09-24 | Disposition: A | Discharge status: Home / Self Care | Payer: Medicare Other | Source: Ambulatory Visit | Attending: Radiation Oncology | Admitting: Radiation Oncology

## 2011-09-24 VITALS — BP 141/63 | HR 68 | Temp 98.6°F | Wt 133.7 lb

## 2011-09-24 DIAGNOSIS — C3410 Malignant neoplasm of upper lobe, unspecified bronchus or lung: Secondary | ICD-10-CM

## 2011-09-24 NOTE — Progress Notes (Signed)
Reviewed clinic routine.Patient has 3 more treatments on remaining next week on Monday, Wednesday and Friday.Aware of possible side effects.No pain issues.

## 2011-09-24 NOTE — Progress Notes (Signed)
   Department of Radiation Oncology  Phone:  [phone] Fax:        [phone]  Weekly Treatment Note    Name: Heather Rosales Date: 09/24/2011 MRN: [id number] DOB: 1930-02-06   Current dose: 20 Gy  Current fraction: 2   MEDICATIONS: Current Outpatient Prescriptions  Medication Sig Dispense Refill  . amLODipine (NORVASC) 10 MG tablet Take by mouth daily.        Marland Kitchen atenolol (TENORMIN) 25 MG tablet Take by mouth daily.        . benazepril (LOTENSIN) 10 MG tablet Take by mouth daily.        . calcium carbonate (OS-CAL) 1250 MG chewable tablet Chew 1 tablet by mouth daily.        Marland Kitchen levothyroxine (SYNTHROID, LEVOTHROID) 100 MCG tablet Take by mouth daily.        . memantine (NAMENDA) 10 MG tablet Take 10 mg by mouth 2 (two) times daily.      Marland Kitchen omeprazole (PRILOSEC) 10 MG capsule Take by mouth daily.        Marland Kitchen oxybutynin (OXYTROL) 3.9 MG/24HR Place 1 patch onto the skin.        Marland Kitchen tiotropium (SPIRIVA) 18 MCG inhalation capsule Place into inhaler and inhale daily.           ALLERGIES: Penicillins   LABORATORY DATA:  Lab Results  Component Value Date   WBC 8.0 07/26/2009   HGB 11.3* 04/30/2011   HCT 38.0 04/30/2011   MCV 80.6 07/26/2009   PLT 228 07/26/2009   Lab Results  Component Value Date   NA 136 07/26/2009   K 3.6 07/26/2009   CL 98 07/26/2009   CO2 33* 07/26/2009   Lab Results  Component Value Date   ALT 13 07/26/2009   AST 14 07/26/2009   ALKPHOS 50 07/26/2009   BILITOT 0.4 07/26/2009     NARRATIVE: Heather Rosales was seen today for weekly treatment management. The chart was checked and the patient's films were reviewed. The patient is doing well. She completed her second fraction today. No symptoms at this time. No chest discomfort and no change in shortness of breath.  PHYSICAL EXAMINATION: weight is 133 lb 11.2 oz (60.646 kg). Her temperature is 98.6 F (37 C). Her blood pressure is 141/63 and her pulse is 68. Her oxygen saturation is 90%.        ASSESSMENT: The  patient is doing satisfactorily with treatment.  PLAN: We will continue with the patient's radiation treatment as planned.

## 2011-09-24 NOTE — Progress Notes (Signed)
Patient here for weekly assessment for with diagnosis of non-small cell lung cancer. Has completed 2 of 5 fractions of chest SBRT .Shortness of breath on exertion.Uses spiriva.Denies pain.Non-productive cough.Oxygen 90 on room air uses oxygen as needed at home.

## 2011-09-25 ENCOUNTER — Ambulatory Visit: Payer: Medicare Other | Admitting: Radiation Oncology

## 2011-09-28 ENCOUNTER — Encounter: Payer: Self-pay | Admitting: Radiation Oncology

## 2011-09-28 ENCOUNTER — Ambulatory Visit
Admission: RE | Admit: 2011-09-28 | Discharge: 2011-09-28 | Disposition: A | Discharge status: Home / Self Care | Payer: Medicare Other | Source: Ambulatory Visit | Attending: Radiation Oncology | Admitting: Radiation Oncology

## 2011-09-28 DIAGNOSIS — C3410 Malignant neoplasm of upper lobe, unspecified bronchus or lung: Secondary | ICD-10-CM

## 2011-09-29 ENCOUNTER — Ambulatory Visit: Payer: Medicare Other | Admitting: Radiation Oncology

## 2011-09-29 NOTE — Progress Notes (Signed)
  Radiation Oncology         [phone]) [phone] ________________________________  Name: Heather Rosales MRN: [id number]  Date: 09/28/2011  DOB: 28-Jun-1930   Simulation verification note  The patient underwent film verification for the patient's set-up in preparation for stereotactic body radiosurgery. The patient was placed on the treatment unit and a CT scan was performed. These images were then fused with the patient's planning CT scan. The fusion was carefully reviewed in terms of the patient's anatomy as it related to the planning CT scan. The target structures as well as the organs at risk were evaluated on the patient's treatment CT scan. The target and the normal structures were appropriately aligned for treatment. Therefore the patient proceeded with the fraction of stereotactic body radiosurgery.  Fraction: 3  Dose:  30 Gy   ________________________________  Radene Gunning, MD, PhD

## 2011-09-30 ENCOUNTER — Encounter: Payer: Self-pay | Admitting: Radiation Oncology

## 2011-09-30 ENCOUNTER — Ambulatory Visit
Admission: RE | Admit: 2011-09-30 | Discharge: 2011-09-30 | Disposition: A | Discharge status: Home / Self Care | Payer: Medicare Other | Source: Ambulatory Visit | Attending: Radiation Oncology | Admitting: Radiation Oncology

## 2011-10-01 ENCOUNTER — Ambulatory Visit: Payer: Medicare Other | Admitting: Radiation Oncology

## 2011-10-01 NOTE — Progress Notes (Signed)
  Radiation Oncology         [phone]) [phone] ________________________________  Name: SHAMERA SPIEGELBERG MRN: [id number]  Date: 09/30/2011  DOB: January 09, 1930  Stereotactic Body Radiotherapy Treatment Procedure Note  NARRATIVE:  AYSLINN GROSHEK was brought to the stereotactic radiation treatment machine and placed supine on the CT couch. The patient was set up for stereotactic body radiotherapy on the body fix pillow.  3D TREATMENT PLANNING AND DOSIMETRY:  The patient's radiation plan was reviewed and approved prior to starting treatment.  It showed 3-dimensional radiation distributions overlaid onto the planning CT.  The [facility] for the target structures as well as the organs at risk were reviewed. The documentation of this is filed in the radiation oncology EMR.  SIMULATION VERIFICATION:  The patient underwent CT imaging on the treatment unit.  These were carefully aligned to document that the ablative radiation dose would cover the target volume and maximally spare the nearby organs at risk according to the planned distribution.  SPECIAL TREATMENT PROCEDURE: AZRA PANTEL received high dose ablative stereotactic body radiotherapy to the planned target volume without unforeseen complications. Treatment was delivered uneventfully. The high doses associated with stereotactic body radiotherapy and the significant potential risks require careful treatment set up and patient monitoring constituting a special treatment procedure   STEREOTACTIC TREATMENT MANAGEMENT:  Following delivery, the patient was evaluated clinically. The patient tolerated treatment without significant acute effects, and was discharged to home in stable condition.    PLAN: Continue treatment as planned.  ________________________________  Lonie Peak, MD

## 2011-10-02 ENCOUNTER — Ambulatory Visit
Admission: RE | Admit: 2011-10-02 | Discharge: 2011-10-02 | Disposition: A | Discharge status: Home / Self Care | Payer: Medicare Other | Source: Ambulatory Visit | Attending: Radiation Oncology | Admitting: Radiation Oncology

## 2011-10-02 ENCOUNTER — Encounter: Payer: Self-pay | Admitting: Radiation Oncology

## 2011-10-02 DIAGNOSIS — C3410 Malignant neoplasm of upper lobe, unspecified bronchus or lung: Secondary | ICD-10-CM

## 2011-10-02 NOTE — Progress Notes (Signed)
pateint here weekly rad txs, completed 5/5  Nonproductive congestive cough, appetite fair, no c/o pain, hoarseness, 1 month f/u appt card given No nausea, no sob

## 2011-10-05 NOTE — Progress Notes (Signed)
  Radiation Oncology         [phone]) [phone] ________________________________  Name: Heather Rosales MRN: [id number]  Date: 10/02/2011  DOB: September 21, 1930   Simulation verification note  The patient underwent film verification for the patient's set-up in preparation for stereotactic body radiosurgery. The patient was placed on the treatment unit and a CT scan was performed. These images were then fused with the patient's planning CT scan. The fusion was carefully reviewed in terms of the patient's anatomy as it related to the planning CT scan. The target structures as well as the organs at risk were evaluated on the patient's treatment CT scan. The target and the normal structures were appropriately aligned for treatment. Therefore the patient proceeded with the fraction of stereotactic body radiosurgery.  Fraction: 5  Dose:  50 Gy   ________________________________  Radene Gunning, MD, PhD

## 2011-10-15 ENCOUNTER — Encounter: Payer: Medicare Other | Admitting: Internal Medicine

## 2011-10-15 DIAGNOSIS — J449 Chronic obstructive pulmonary disease, unspecified: Secondary | ICD-10-CM

## 2011-10-15 DIAGNOSIS — J4489 Other specified chronic obstructive pulmonary disease: Secondary | ICD-10-CM

## 2011-10-15 DIAGNOSIS — F0390 Unspecified dementia without behavioral disturbance: Secondary | ICD-10-CM

## 2011-10-15 DIAGNOSIS — C3490 Malignant neoplasm of unspecified part of unspecified bronchus or lung: Secondary | ICD-10-CM

## 2011-10-23 ENCOUNTER — Encounter: Payer: Self-pay | Admitting: Radiation Oncology

## 2011-10-29 ENCOUNTER — Ambulatory Visit: Payer: Medicare Other | Admitting: Radiation Oncology

## 2011-11-05 ENCOUNTER — Encounter: Payer: Self-pay | Admitting: Radiation Oncology

## 2011-11-05 ENCOUNTER — Ambulatory Visit
Admission: RE | Admit: 2011-11-05 | Discharge: 2011-11-05 | Disposition: A | Discharge status: Home / Self Care | Payer: Medicare Other | Source: Ambulatory Visit | Attending: Radiation Oncology | Admitting: Radiation Oncology

## 2011-11-05 VITALS — BP 158/65 | HR 98 | Temp 97.8°F | Resp 22 | Wt 131.7 lb

## 2011-11-05 DIAGNOSIS — C3410 Malignant neoplasm of upper lobe, unspecified bronchus or lung: Secondary | ICD-10-CM

## 2011-11-05 NOTE — Progress Notes (Signed)
  Radiation Oncology         [phone] ________________________________  Name: SHERETA CROTHERS MRN: [id number]  Date: 11/05/2011  DOB: November 12, 1930  Follow-Up Visit Note  CC: VYAS,DHRUV B., MD  Ignatius Specking., MD  Diagnosis:   Non-small cell lung cancer  Interval Since Last Radiation:  One month   Narrative:  The patient returns today for routine follow-up.  The patient completed a course of stereotactic body radiotherapy to 2 distinct target lesions. Both of these areas were treated to a slightly lower dose given her history to a dose of 50 gray. The patient states that she is done well since she finished treatment. Her and her husband denied any worsening shortness of breath. She does use oxygen at home. No change in her need for this and no increase in the frequency of her other pulmonary medications. The patient denies any esophagitis or other chest pain.                              ALLERGIES:  is allergic to penicillins.  Meds: Current Outpatient Prescriptions  Medication Sig Dispense Refill  . amLODipine (NORVASC) 10 MG tablet Take by mouth daily.        Marland Kitchen atenolol (TENORMIN) 25 MG tablet Take by mouth daily.        . benazepril (LOTENSIN) 10 MG tablet Take by mouth daily.        . calcium carbonate (OS-CAL) 1250 MG chewable tablet Chew 1 tablet by mouth daily.        Marland Kitchen levothyroxine (SYNTHROID, LEVOTHROID) 100 MCG tablet Take by mouth daily.        . memantine (NAMENDA) 10 MG tablet Take 10 mg by mouth 2 (two) times daily.      Marland Kitchen omeprazole (PRILOSEC) 10 MG capsule Take by mouth daily.        Marland Kitchen oxybutynin (OXYTROL) 3.9 MG/24HR Place 1 patch onto the skin.        Marland Kitchen tiotropium (SPIRIVA) 18 MCG inhalation capsule Place into inhaler and inhale daily.          Physical Findings: The patient is in no acute distress. Patient is alert and oriented.  weight is 131 lb 11.2 oz (59.739 kg). Her oral temperature is 97.8 F (36.6 C). Her blood pressure is 158/65 and her pulse is 98. Her  respiration is 22 and oxygen saturation is 89%. .   A few scattered crackles present in both lungs  Lab Findings: Lab Results  Component Value Date   WBC 8.0 07/26/2009   HGB 11.3* 04/30/2011   HCT 38.0 04/30/2011   MCV 80.6 07/26/2009   PLT 228 07/26/2009     Radiographic Findings: No results found.  Impression:    The patient is a 78 year old female who completed a course of stereotactic body radiotherapy to 2 lung lesions one month ago. She has done well clinically since that time with no evidence today of acute toxicity.  Plan:  The patient indicates that she has repeat imaging scheduled with followup with Dr. Ubaldo Glassing. I will have her return to our clinic in 4 months for followup.   Radene Gunning, M.D., Ph.D.

## 2011-11-05 NOTE — Progress Notes (Signed)
Patient here f/u rad txs Lung on dates: 09/22/11, 09/24/11, 09/28/11, 09/30/11, & 10/02/11 Alert,orinted x3, hoh,didn't wear hearing aids, hoarse still,"once in a wgile hard to swallow foods", 89% room air sats, patient wears oxygen at home, has only big tank, didn't bring, rr=22 at first now at 20, coughs up pheglm occasionally, congestive cough, eats fair,  Tires out easily,  No changes in meds 1:41 PM

## 2011-11-26 NOTE — Progress Notes (Signed)
  Radiation Oncology         [phone] ________________________________  Name: Heather Rosales MRN: [id number]  Date: 09/24/2011  DOB: [birth year]   Simulation verification note  The patient underwent film verification for the patient's set-up in preparation for stereotactic body radiosurgery. The patient was placed on the treatment unit and a CT scan was performed. These images were then fused with the patient's planning CT scan. The fusion was carefully reviewed in terms of the patient's anatomy as it related to the planning CT scan. The target structures as well as the organs at risk were evaluated on the patient's treatment CT scan. The target and the normal structures were appropriately aligned for treatment. Therefore the patient proceeded with the fraction of stereotactic body radiosurgery.  Fraction: 2  Dose:  20 Gy   ________________________________  Radene Gunning, MD, PhD

## 2011-11-26 NOTE — Progress Notes (Signed)
[facility] Radiation Oncology Simulation and Treatment Planning Note   Name:  Heather Rosales MRN: [id number]   Date: 09/10/2011  DOB: May 30, 1930  Status:outpatient    DIAGNOSIS: The encounter diagnosis was Malignant neoplasm of upper lobe, bronchus or lung.  SITE:  There are 3 tumors within the right lung which will be treated with SBRT.   CONSENT VERIFIED:yes   SET UP: Patient is setup supine   IMMOBILIZATION: The patient was immobilized using a Vac Loc bag, and a customized active form device was also constructed to aid in patient immobilization. The patient set up also involved abdominal compression to attempt to reduce respiratory motion. A total of 2 complex treatment devices therefore will be used for immobilization during the course of radiation.    NARRATIVE:The patient was brought to the CT Simulation planning suite.  Identity was confirmed.  All relevant records and images related to the planned course of therapy were reviewed.  Then, the patient was positioned in a stable reproducible clinical set-up for radiation therapy. Abdominal compression was applied by me.  4D CT images were obtained and reproducible breathing pattern was confirmed. Free breathing CT images were obtained.  Skin markings were placed.  The CT images were loaded into the planning software where the target and avoidance structures were contoured.  The radiation prescription was entered and confirmed.    TREATMENT PLANNING NOTE:  Treatment planning then occurred. I have requested : MLC's, 3D simulation/ isodose plan, basic dose calculation. It is anticipated that for customized fields will be used for the patient's treatment, with each of these corresponding to an additional complex treatment device.  3 dimensional simulation is performed and dose volume histogram of the gross tumor volume, planning tumor volume and criticial normal structures including the spinal cord and lungs were analyzed  and requested.  Special treatment procedure was performed due to high dose per fraction and the complexity of the planning process.  The patient will be monitored for increased risk of toxicity.  Daily imaging using cone beam CT/ MV CT will be used for target localization.   PLAN:  The patient will receive 50 Gy in 5 fractions. This treatment will deliver this dose to 3 separate targets within the right lung. This represents a course of reirradiation.   ________________________________   Radene Gunning, MD, PhD

## 2011-11-26 NOTE — Progress Notes (Signed)
  Radiation Oncology         [phone] ________________________________  Name: Heather Rosales MRN: [id number]  Date: 09/22/2011  DOB: [birth year]  Simulation verification note  The patient underwent film verification for the patient's set-up in preparation for stereotactic body radiosurgery. The patient was placed on the treatment unit and a CT scan was performed. These images were then fused with the patient's planning CT scan. The fusion was carefully reviewed in terms of the patient's anatomy as it related to the planning CT scan. The target structures as well as the organs at risk were evaluated and also the isodose curves were also reviewed on the patient's treatment CT scan. The target and the normal structures were appropriately aligned for treatment. Therefore the patient proceeded with the first fraction of stereotactic body radiosurgery. She is being treated to 2 distinct target regions for a total of 3 gross tumor volumes which have been contoured. The patient's first fraction will deliver 10 gray to both of these target sites.   ________________________________  Radene Gunning, MD, PhD

## 2011-11-26 NOTE — Progress Notes (Signed)
  Radiation Oncology         [phone] ________________________________  Name: Heather Rosales MRN: [id number]  Date: 10/02/2011  DOB: 04-16-1930  End of Treatment Note  Diagnosis:   Lung cancer, recurrent     Indication for treatment:  Palliative       Radiation treatment dates:   09/22/2011 through 10/02/2011  Site/dose:   The patient was treated to 3 tumors within the right lung. Each of these areas received 50 gray in 5 fractions at 10 gray per fraction on an every other day basis. This consisted of a course of stereotactic body radiotherapy. This represented a course of reirradiation due to prior treatment. The patient's course of treatment consisted of 4 complex treatment devices/treatment fields.  Narrative: The patient tolerated radiation treatment relatively well.   The patient did not exhibit any change in breathing and did not exhibit any significant chest pain/esophagitis.  Plan: The patient has completed radiation treatment. The patient will return to radiation oncology clinic for routine followup in one month. I advised the patient to call or return sooner if they have any questions or concerns related to their recovery or treatment. ________________________________  Radene Gunning, M.D., Ph.D.

## 2012-01-27 ENCOUNTER — Encounter: Payer: Medicare Other | Admitting: Internal Medicine

## 2012-01-27 DIAGNOSIS — C3490 Malignant neoplasm of unspecified part of unspecified bronchus or lung: Secondary | ICD-10-CM

## 2012-01-27 DIAGNOSIS — F0390 Unspecified dementia without behavioral disturbance: Secondary | ICD-10-CM

## 2012-01-27 DIAGNOSIS — J449 Chronic obstructive pulmonary disease, unspecified: Secondary | ICD-10-CM

## 2012-03-03 ENCOUNTER — Ambulatory Visit
Admission: RE | Admit: 2012-03-03 | Discharge: 2012-03-03 | Disposition: A | Discharge status: Home / Self Care | Payer: Medicare Other | Source: Ambulatory Visit | Attending: Radiation Oncology | Admitting: Radiation Oncology

## 2012-03-03 ENCOUNTER — Encounter: Payer: Self-pay | Admitting: Radiation Oncology

## 2012-03-03 NOTE — Progress Notes (Signed)
Follow up s/o rad tx Lung Cancer 09/22/11-10/02/11 Alert oriented to name, (alzheimer's),spouse with patient, not coughing, steady gait, has acid reflux states spouse, pateint not wearing her hearing aids today oxygen sats=89% room air, wears oxygen at home,spouse stated "I checked it this am was 94% room air", no c/o pain  10:26 AM

## 2012-03-05 NOTE — Progress Notes (Signed)
Radiation Oncology         [phone] ________________________________  Name: Heather Rosales MRN: [id number]  Date: 03/03/2012  DOB: 1930/05/25  Follow-Up Visit Note  CC: Ignatius Specking., MD  Ignatius Specking., MD  Diagnosis:   Lung cancer, recurrent  Interval Since Last Radiation:  5 months   Narrative:  The patient returns today for routine follow-up.  The patient completed radiotherapy on 10/02/2011. 3 recurrent tumors within the right lung were targeted at that time. The patient is accompanied by her husband today. He states that her her Alzheimer's disease has worsened some. She denies any worsening shortness of breath or changes in breathing. She denies any chest discomfort. The patient also denies any difficulties with esophagitis. The patient continues to be seen by medical oncology in [place] on a regular basis.                              ALLERGIES:  is allergic to penicillins.  Meds: Current Outpatient Prescriptions  Medication Sig Dispense Refill  . amLODipine (NORVASC) 10 MG tablet Take by mouth daily.        Marland Kitchen atenolol (TENORMIN) 25 MG tablet Take by mouth daily.        . benazepril (LOTENSIN) 10 MG tablet Take by mouth daily.        . calcium carbonate (OS-CAL) 1250 MG chewable tablet Chew 1 tablet by mouth daily.        . ciprofloxacin (CIPRO) 250 MG tablet Take 250 mg by mouth daily.      Marland Kitchen levothyroxine (SYNTHROID, LEVOTHROID) 100 MCG tablet Take by mouth daily.        . memantine (NAMENDA) 10 MG tablet Take 10 mg by mouth 2 (two) times daily.      Marland Kitchen omeprazole (PRILOSEC) 10 MG capsule Take by mouth daily.        Marland Kitchen oxybutynin (DITROPAN) 5 MG tablet Take 5 mg by mouth daily.      Marland Kitchen oxybutynin (OXYTROL) 3.9 MG/24HR Place 1 patch onto the skin.        . polyethylene glycol powder (GLYCOLAX/MIRALAX) powder Take 17 g by mouth as needed.      . tiotropium (SPIRIVA) 18 MCG inhalation capsule Place into inhaler and inhale daily.         No current facility-administered  medications for this encounter.    Physical Findings: The patient is in no acute distress. Patient is alert and oriented.  weight is 132 lb (59.875 kg). Her oral temperature is 97.7 F (36.5 C). Her blood pressure is 146/66 and her pulse is 59. Her respiration is 20 and oxygen saturation is 89%. .   General: Well-developed, in no acute distress HEENT: Normocephalic, atraumatic Cardiovascular: Regular rate and rhythm Respiratory: Clear to auscultation bilaterally GI: Soft, nontender, normal bowel sounds Extremities: No edema present   Lab Findings: Lab Results  Component Value Date   WBC 8.0 07/26/2009   HGB 11.3* 04/30/2011   HCT 38.0 04/30/2011   MCV 80.6 07/26/2009   PLT 228 07/26/2009     Radiographic Findings: No results found.  Impression:    The patient is doing well with no new symptoms at this time. No new related complaints today. The patient is followed closely by medical oncology and the patient's husband states that her last scan looked fine. We discussed possible followup options and elected to have her return to our clinic on a when necessary  basis.  I spent 10 minutes with the patient today, the majority of which was spent counseling the patient on the diagnosis of cancer and coordinating care.  Radene Gunning, M.D., Ph.D.

## 2012-07-28 DIAGNOSIS — R079 Chest pain, unspecified: Secondary | ICD-10-CM

## 2012-08-03 DIAGNOSIS — D649 Anemia, unspecified: Secondary | ICD-10-CM

## 2012-08-03 DIAGNOSIS — C3490 Malignant neoplasm of unspecified part of unspecified bronchus or lung: Secondary | ICD-10-CM

## 2012-08-09 DIAGNOSIS — J90 Pleural effusion, not elsewhere classified: Secondary | ICD-10-CM

## 2012-08-09 DIAGNOSIS — C3490 Malignant neoplasm of unspecified part of unspecified bronchus or lung: Secondary | ICD-10-CM

## 2012-08-31 DIAGNOSIS — J90 Pleural effusion, not elsewhere classified: Secondary | ICD-10-CM

## 2012-08-31 DIAGNOSIS — I517 Cardiomegaly: Secondary | ICD-10-CM

## 2012-08-31 DIAGNOSIS — C3490 Malignant neoplasm of unspecified part of unspecified bronchus or lung: Secondary | ICD-10-CM

## 2012-08-31 DIAGNOSIS — R0989 Other specified symptoms and signs involving the circulatory and respiratory systems: Secondary | ICD-10-CM

## 2012-08-31 DIAGNOSIS — R05 Cough: Secondary | ICD-10-CM

## 2012-08-31 DIAGNOSIS — I319 Disease of pericardium, unspecified: Secondary | ICD-10-CM

## 2012-09-07 DIAGNOSIS — C3490 Malignant neoplasm of unspecified part of unspecified bronchus or lung: Secondary | ICD-10-CM

## 2012-09-07 DIAGNOSIS — Z5111 Encounter for antineoplastic chemotherapy: Secondary | ICD-10-CM

## 2012-09-14 DIAGNOSIS — C3490 Malignant neoplasm of unspecified part of unspecified bronchus or lung: Secondary | ICD-10-CM

## 2012-09-14 DIAGNOSIS — Z5111 Encounter for antineoplastic chemotherapy: Secondary | ICD-10-CM

## 2012-09-21 DIAGNOSIS — C3490 Malignant neoplasm of unspecified part of unspecified bronchus or lung: Secondary | ICD-10-CM

## 2012-09-21 DIAGNOSIS — Z5111 Encounter for antineoplastic chemotherapy: Secondary | ICD-10-CM

## 2012-10-04 DIAGNOSIS — C3490 Malignant neoplasm of unspecified part of unspecified bronchus or lung: Secondary | ICD-10-CM

## 2012-10-04 DIAGNOSIS — R05 Cough: Secondary | ICD-10-CM

## 2012-10-04 DIAGNOSIS — R059 Cough, unspecified: Secondary | ICD-10-CM

## 2012-10-05 DIAGNOSIS — Z5111 Encounter for antineoplastic chemotherapy: Secondary | ICD-10-CM

## 2012-10-05 DIAGNOSIS — C3490 Malignant neoplasm of unspecified part of unspecified bronchus or lung: Secondary | ICD-10-CM

## 2012-10-12 DIAGNOSIS — Z5111 Encounter for antineoplastic chemotherapy: Secondary | ICD-10-CM

## 2012-10-12 DIAGNOSIS — C3490 Malignant neoplasm of unspecified part of unspecified bronchus or lung: Secondary | ICD-10-CM

## 2012-10-19 DIAGNOSIS — Z5111 Encounter for antineoplastic chemotherapy: Secondary | ICD-10-CM

## 2012-10-19 DIAGNOSIS — C3490 Malignant neoplasm of unspecified part of unspecified bronchus or lung: Secondary | ICD-10-CM

## 2012-11-02 DIAGNOSIS — J90 Pleural effusion, not elsewhere classified: Secondary | ICD-10-CM

## 2012-11-02 DIAGNOSIS — Z5111 Encounter for antineoplastic chemotherapy: Secondary | ICD-10-CM

## 2012-11-02 DIAGNOSIS — C3490 Malignant neoplasm of unspecified part of unspecified bronchus or lung: Secondary | ICD-10-CM

## 2012-11-09 DIAGNOSIS — Z5111 Encounter for antineoplastic chemotherapy: Secondary | ICD-10-CM

## 2012-11-09 DIAGNOSIS — C3430 Malignant neoplasm of lower lobe, unspecified bronchus or lung: Secondary | ICD-10-CM

## 2012-11-16 DIAGNOSIS — Z5111 Encounter for antineoplastic chemotherapy: Secondary | ICD-10-CM

## 2012-11-16 DIAGNOSIS — C3490 Malignant neoplasm of unspecified part of unspecified bronchus or lung: Secondary | ICD-10-CM

## 2012-11-16 DIAGNOSIS — Z23 Encounter for immunization: Secondary | ICD-10-CM

## 2012-11-29 DIAGNOSIS — T451X5A Adverse effect of antineoplastic and immunosuppressive drugs, initial encounter: Secondary | ICD-10-CM

## 2012-11-29 DIAGNOSIS — R05 Cough: Secondary | ICD-10-CM

## 2012-11-29 DIAGNOSIS — C3490 Malignant neoplasm of unspecified part of unspecified bronchus or lung: Secondary | ICD-10-CM

## 2012-11-29 DIAGNOSIS — D6481 Anemia due to antineoplastic chemotherapy: Secondary | ICD-10-CM

## 2012-12-19 IMAGING — PT NM PET TUM IMG RESTAG (PS) SKULL BASE T - THIGH
6 series · 25 of 25 positions shown · non-contrast
Comparison: PET CT scan 04/03/2008

CLINICAL DATA: Subsequent treatment strategy for lung cancer.
Surgery in  8881.  Chemotherapy and radiation therapy complete.

NUCLEAR MEDICINE PET SKULL BASE TO THIGH
Fasting Blood Glucose:  105
TECHNIQUE: 17.5 mCi F-18 FDG was injected intravenously via the
right antecubital fossa.  Full-ring PET imaging was performed from
the skull base through the mid-thighs 72  minutes after injection.
CT data was obtained and used for attenuation correction and
anatomic localization only.  (This was not acquired as a diagnostic
CT examination.)

[Series 1: pet ac · axial · 3.3mm · 4.69mm/px · z∈[-748,-22]mm · 5 of 223 slices shown]
[im 1/223]
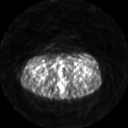
[im 56/223]
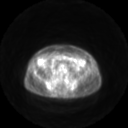
[im 112/223]
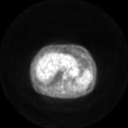
[im 167/223]
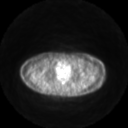
[im 223/223]
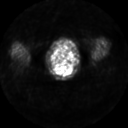

[Series 2: ct images · axial · 3.8mm · 0.98mm/px · z∈[-748,-22]mm · 5 of 223 slices shown]
[im 1/223]
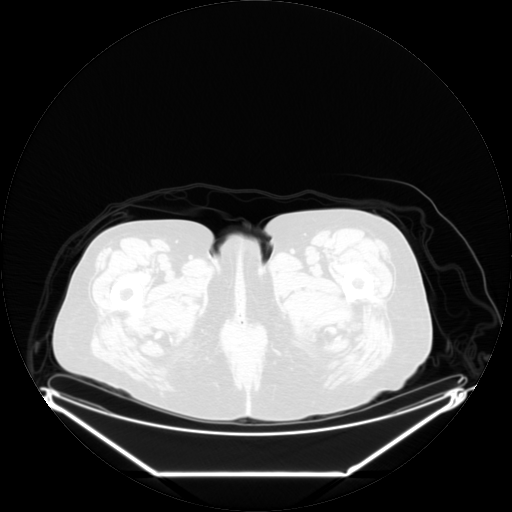
[im 56/223]
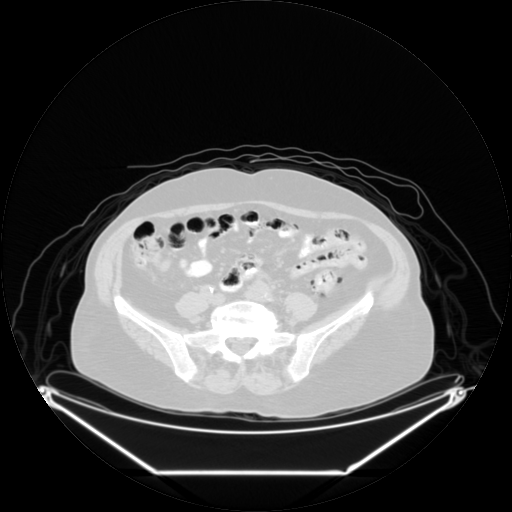
[im 112/223]
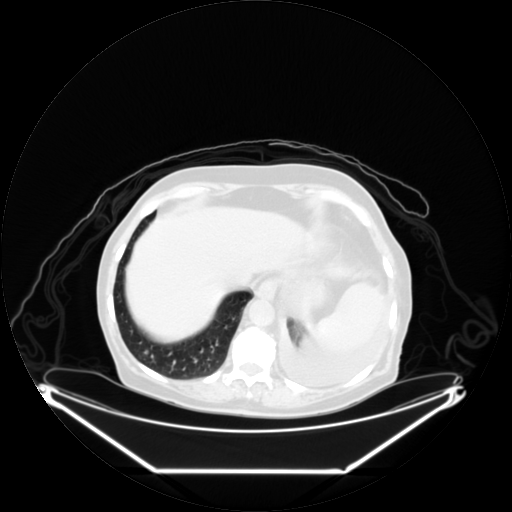
[im 167/223]
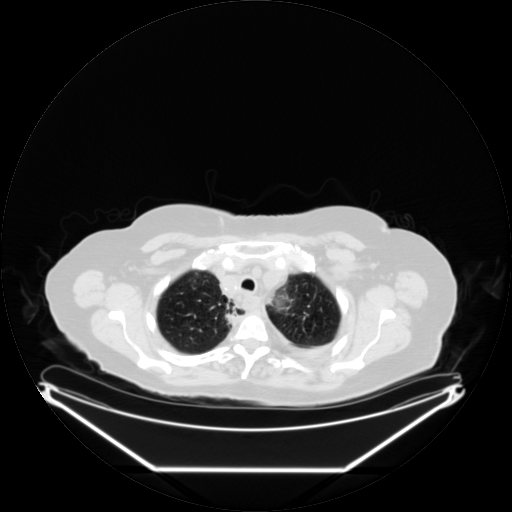
[im 223/223  brain]
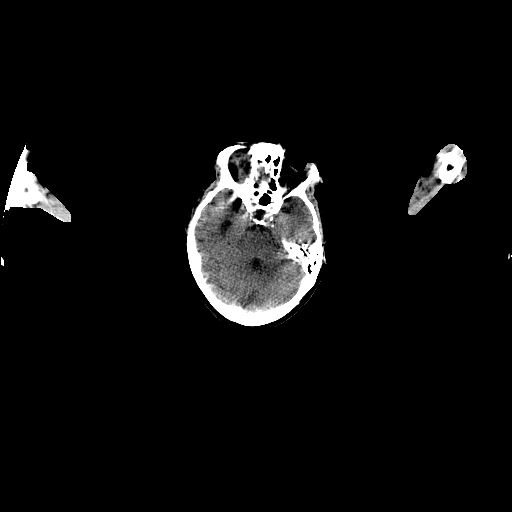

[Series 2: pet nac · axial · 3.3mm · 4.69mm/px · z∈[-748,-22]mm · 6 of 223 slices shown]
[im 1/223]
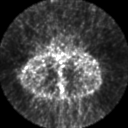
[im 45/223]
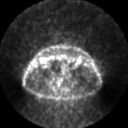
[im 89/223]
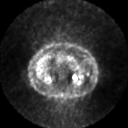
[im 134/223]
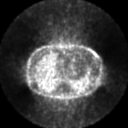
[im 178/223]
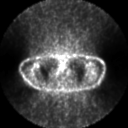
[im 223/223]
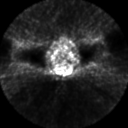

[Series 123: mip · coronal · 3.3mm · 4.69mm/px · 1 of 30 slices shown]
[im 1/30]
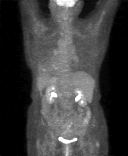

[Series 151: reformatted · axial · 3.3mm · 3.91mm/px · z∈[-748,-22]mm · 6 of 223 slices shown (1 of 2)]
[im 1/223]
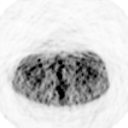
[im 45/223]
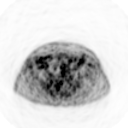
[im 89/223]
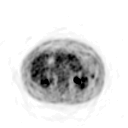
[im 134/223]
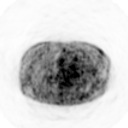
[im 178/223]
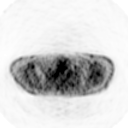
[im 223/223]
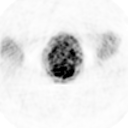

[Series 153: reformatted · coronal · 4.7mm · 5.83mm/px · 2 of 67 slices shown (2 of 2)]
[im 1/67]
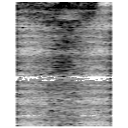
[im 67/67]
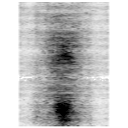

[25 of 25 positions shown; findings below may reference images not displayed]

FINDINGS: Neck:No hypermetabolic nodes in the neck.

Chest:No hypermetabolic mediastinal or hilar lymph nodes.  Tiny 3
mm nodule in the right upper lobe is not changed.  Previously
described 8 mm nodule right lower lobe is no longer evident.  Small
left  effusion  present.

Abdomen / Pelvis:No abnormal hypermetabolic activity within the
solid organs.  No evidence of abdominal or pelvic hypermetabolic
nodes.  Multiple diverticula of the sigmoid colon without acute
inflammation.

Skeleton:Study is technically adequate.  There are no filling
defects within the pulmonary arteries to suggest acute pulmonary
embolism.

Skeleton
IMPRESSION: No evidence of lung cancer recurrence or metastasis.

Diverticulosis without diverticulitis.

Small left effusion.

## 2013-03-05 DEATH — deceased

## 2015-02-25 ENCOUNTER — Telehealth: Payer: Self-pay

## 2015-02-25 NOTE — Telephone Encounter (Signed)
Opened in error
